# Patient Record
Sex: Female | Born: 1955 | Race: White | Hispanic: No | Marital: Single | Smoking: Never smoker
Health system: Southern US, Community
[De-identification: ages and names within clinical notes are randomized; demographics above are authoritative.]

## PROBLEM LIST (undated history)

## (undated) DIAGNOSIS — M1990 Unspecified osteoarthritis, unspecified site: Secondary | ICD-10-CM

## (undated) DIAGNOSIS — K219 Gastro-esophageal reflux disease without esophagitis: Secondary | ICD-10-CM

## (undated) DIAGNOSIS — G4730 Sleep apnea, unspecified: Secondary | ICD-10-CM

## (undated) DIAGNOSIS — T7840XA Allergy, unspecified, initial encounter: Secondary | ICD-10-CM

## (undated) DIAGNOSIS — A6920 Lyme disease, unspecified: Secondary | ICD-10-CM

## (undated) DIAGNOSIS — I10 Essential (primary) hypertension: Secondary | ICD-10-CM

## (undated) DIAGNOSIS — F32A Depression, unspecified: Secondary | ICD-10-CM

## (undated) DIAGNOSIS — J45909 Unspecified asthma, uncomplicated: Secondary | ICD-10-CM

## (undated) HISTORY — DX: Unspecified osteoarthritis, unspecified site: M19.90

## (undated) HISTORY — DX: Sleep apnea, unspecified: G47.30

## (undated) HISTORY — DX: Gastro-esophageal reflux disease without esophagitis: K21.9

## (undated) HISTORY — DX: Depression, unspecified: F32.A

## (undated) HISTORY — PX: ABDOMINAL HYSTERECTOMY: SHX81

## (undated) HISTORY — DX: Unspecified asthma, uncomplicated: J45.909

## (undated) HISTORY — DX: Allergy, unspecified, initial encounter: T78.40XA

---

## 2018-05-29 DIAGNOSIS — E785 Hyperlipidemia, unspecified: Secondary | ICD-10-CM | POA: Insufficient documentation

## 2019-05-19 DIAGNOSIS — R7303 Prediabetes: Secondary | ICD-10-CM | POA: Insufficient documentation

## 2020-12-21 ENCOUNTER — Other Ambulatory Visit: Payer: Self-pay

## 2020-12-21 ENCOUNTER — Emergency Department: Payer: 59

## 2020-12-21 ENCOUNTER — Encounter: Payer: Self-pay | Admitting: Emergency Medicine

## 2020-12-21 ENCOUNTER — Inpatient Hospital Stay
Admission: EM | Admit: 2020-12-21 | Discharge: 2020-12-24 | DRG: 178 | Disposition: A | Discharge status: Home / Self Care | Payer: 59 | Attending: Internal Medicine | Admitting: Internal Medicine

## 2020-12-21 DIAGNOSIS — R9431 Abnormal electrocardiogram [ECG] [EKG]: Secondary | ICD-10-CM | POA: Diagnosis present

## 2020-12-21 DIAGNOSIS — M25541 Pain in joints of right hand: Secondary | ICD-10-CM | POA: Diagnosis present

## 2020-12-21 DIAGNOSIS — I10 Essential (primary) hypertension: Secondary | ICD-10-CM | POA: Diagnosis present

## 2020-12-21 DIAGNOSIS — Z6832 Body mass index (BMI) 32.0-32.9, adult: Secondary | ICD-10-CM | POA: Diagnosis not present

## 2020-12-21 DIAGNOSIS — Z881 Allergy status to other antibiotic agents status: Secondary | ICD-10-CM

## 2020-12-21 DIAGNOSIS — Z880 Allergy status to penicillin: Secondary | ICD-10-CM | POA: Diagnosis not present

## 2020-12-21 DIAGNOSIS — G8929 Other chronic pain: Secondary | ICD-10-CM | POA: Diagnosis present

## 2020-12-21 DIAGNOSIS — A6920 Lyme disease, unspecified: Secondary | ICD-10-CM | POA: Diagnosis present

## 2020-12-21 DIAGNOSIS — R1013 Epigastric pain: Secondary | ICD-10-CM

## 2020-12-21 DIAGNOSIS — A0839 Other viral enteritis: Secondary | ICD-10-CM | POA: Diagnosis present

## 2020-12-21 DIAGNOSIS — E669 Obesity, unspecified: Secondary | ICD-10-CM | POA: Diagnosis present

## 2020-12-21 DIAGNOSIS — K922 Gastrointestinal hemorrhage, unspecified: Secondary | ICD-10-CM

## 2020-12-21 DIAGNOSIS — U071 COVID-19: Principal | ICD-10-CM | POA: Diagnosis present

## 2020-12-21 DIAGNOSIS — R079 Chest pain, unspecified: Secondary | ICD-10-CM

## 2020-12-21 DIAGNOSIS — K529 Noninfective gastroenteritis and colitis, unspecified: Secondary | ICD-10-CM | POA: Diagnosis present

## 2020-12-21 HISTORY — DX: Essential (primary) hypertension: I10

## 2020-12-21 HISTORY — DX: Lyme disease, unspecified: A69.20

## 2020-12-21 LAB — CBC
HCT: 39.3 % (ref 36.0–46.0)
Hemoglobin: 13.5 g/dL (ref 12.0–15.0)
MCH: 30.5 pg (ref 26.0–34.0)
MCHC: 34.4 g/dL (ref 30.0–36.0)
MCV: 88.9 fL (ref 80.0–100.0)
Platelets: 251 10*3/uL (ref 150–400)
RBC: 4.42 MIL/uL (ref 3.87–5.11)
RDW: 12 % (ref 11.5–15.5)
WBC: 11.7 10*3/uL — ABNORMAL HIGH (ref 4.0–10.5)
nRBC: 0 % (ref 0.0–0.2)

## 2020-12-21 LAB — COMPREHENSIVE METABOLIC PANEL
ALT: 19 U/L (ref 0–44)
AST: 26 U/L (ref 15–41)
Albumin: 4 g/dL (ref 3.5–5.0)
Alkaline Phosphatase: 83 U/L (ref 38–126)
Anion gap: 13 (ref 5–15)
BUN: 17 mg/dL (ref 8–23)
CO2: 20 mmol/L — ABNORMAL LOW (ref 22–32)
Calcium: 8.9 mg/dL (ref 8.9–10.3)
Chloride: 103 mmol/L (ref 98–111)
Creatinine, Ser: 0.76 mg/dL (ref 0.44–1.00)
GFR, Estimated: >60 mL/min (ref >60)
Glucose, Bld: 184 mg/dL — ABNORMAL HIGH (ref 70–99)
Potassium: 3.6 mmol/L (ref 3.5–5.1)
Sodium: 136 mmol/L (ref 135–145)
Total Bilirubin: 0.6 mg/dL (ref 0.3–1.2)
Total Protein: 7.5 g/dL (ref 6.5–8.1)

## 2020-12-21 LAB — TYPE AND SCREEN
ABO/RH(D): A POS
Antibody Screen: NEGATIVE

## 2020-12-21 LAB — TROPONIN I (HIGH SENSITIVITY)
Troponin I (High Sensitivity): 3 ng/L (ref <18)
Troponin I (High Sensitivity): 2 ng/L (ref <18)

## 2020-12-21 LAB — URINALYSIS, COMPLETE (UACMP) WITH MICROSCOPIC
Bacteria, UA: NONE SEEN
Bilirubin Urine: NEGATIVE
Glucose, UA: NEGATIVE mg/dL
Ketones, ur: NEGATIVE mg/dL
Leukocytes,Ua: NEGATIVE
Nitrite: NEGATIVE
Protein, ur: NEGATIVE mg/dL
Specific Gravity, Urine: 1.035 — ABNORMAL HIGH (ref 1.005–1.030)
pH: 5 (ref 5.0–8.0)

## 2020-12-21 LAB — LIPASE, BLOOD: Lipase: 30 U/L (ref 11–51)

## 2020-12-21 LAB — TSH: TSH: 0.528 u[IU]/mL (ref 0.350–4.500)

## 2020-12-21 LAB — PROTIME-INR
INR: 1.1 (ref 0.8–1.2)
Prothrombin Time: 13.3 seconds (ref 11.4–15.2)

## 2020-12-21 LAB — LACTIC ACID, PLASMA: Lactic Acid, Venous: 1.2 mmol/L (ref 0.5–1.9)

## 2020-12-21 LAB — APTT: aPTT: 29 seconds (ref 24–36)

## 2020-12-21 LAB — SARS CORONAVIRUS 2 (TAT 6-24 HRS): SARS Coronavirus 2: POSITIVE — AB

## 2020-12-21 MED ORDER — PROCHLORPERAZINE EDISYLATE 10 MG/2ML IJ SOLN
10.0000 mg | INTRAMUSCULAR | Status: AC | PRN
Start: 2020-12-21 — End: 2020-12-24
  Administered 2020-12-22 – 2020-12-24 (×3): 10 mg via INTRAVENOUS
  Filled 2020-12-21 (×5): qty 2

## 2020-12-21 MED ORDER — MORPHINE SULFATE (PF) 4 MG/ML IV SOLN
4.0000 mg | Freq: Once | INTRAVENOUS | Status: AC
  Administered 2020-12-21: 4 mg via INTRAVENOUS
  Filled 2020-12-21: qty 1

## 2020-12-21 MED ORDER — LACTATED RINGERS IV SOLN: INTRAVENOUS | Status: AC

## 2020-12-21 MED ORDER — ONDANSETRON HCL 4 MG/2ML IJ SOLN: 4.0000 mg | Freq: Four times a day (QID) | INTRAMUSCULAR | Status: DC | PRN

## 2020-12-21 MED ORDER — ACETAMINOPHEN 650 MG RE SUPP: 325.0000 mg | Freq: Four times a day (QID) | RECTAL | Status: AC | PRN

## 2020-12-21 MED ORDER — KETOROLAC TROMETHAMINE 15 MG/ML IJ SOLN
15.0000 mg | Freq: Four times a day (QID) | INTRAMUSCULAR | Status: AC | PRN
Start: 2020-12-21 — End: 2020-12-22
  Administered 2020-12-22: 15 mg via INTRAVENOUS
  Filled 2020-12-21: qty 1

## 2020-12-21 MED ORDER — AMLODIPINE BESYLATE 5 MG PO TABS: 10.0000 mg | ORAL_TABLET | Freq: Every day | ORAL | Status: DC

## 2020-12-21 MED ORDER — ONDANSETRON HCL 4 MG/2ML IJ SOLN
4.0000 mg | Freq: Once | INTRAMUSCULAR | Status: AC
  Administered 2020-12-21: 4 mg via INTRAVENOUS
  Filled 2020-12-21: qty 2

## 2020-12-21 MED ORDER — LORATADINE 10 MG PO TABS: 10.0000 mg | ORAL_TABLET | Freq: Every day | ORAL | Status: DC | PRN

## 2020-12-21 MED ORDER — ONDANSETRON 4 MG PO TBDP
4.0000 mg | ORAL_TABLET | Freq: Once | ORAL | Status: AC | PRN
  Administered 2020-12-21: 4 mg via ORAL
  Filled 2020-12-21: qty 1

## 2020-12-21 MED ORDER — SODIUM CHLORIDE 0.9 % IV SOLN
1000.0000 mL | Freq: Once | INTRAVENOUS | Status: AC
  Administered 2020-12-21: 1000 mL via INTRAVENOUS

## 2020-12-21 MED ORDER — ACETAMINOPHEN 325 MG PO TABS
325.0000 mg | ORAL_TABLET | Freq: Four times a day (QID) | ORAL | Status: AC | PRN
  Administered 2020-12-22 – 2020-12-24 (×3): 325 mg via ORAL
  Filled 2020-12-21 (×3): qty 1

## 2020-12-21 MED ORDER — ONDANSETRON HCL 4 MG PO TABS
4.0000 mg | ORAL_TABLET | Freq: Four times a day (QID) | ORAL | Status: DC | PRN
Start: 2020-12-21 — End: 2020-12-21

## 2020-12-21 MED ORDER — ENOXAPARIN SODIUM 40 MG/0.4ML ~~LOC~~ SOLN
40.0000 mg | SUBCUTANEOUS | Status: DC
  Administered 2020-12-24: 40 mg via SUBCUTANEOUS
  Filled 2020-12-21 (×3): qty 0.4

## 2020-12-21 MED ORDER — PROCHLORPERAZINE MALEATE 5 MG PO TABS
5.0000 mg | ORAL_TABLET | Freq: Four times a day (QID) | ORAL | Status: AC | PRN
  Filled 2020-12-21: qty 1

## 2020-12-21 MED ORDER — IOHEXOL 350 MG/ML SOLN
100.0000 mL | Freq: Once | INTRAVENOUS | Status: AC | PRN
  Administered 2020-12-21: 100 mL via INTRAVENOUS

## 2020-12-21 MED ORDER — AMLODIPINE BESYLATE 10 MG PO TABS
10.0000 mg | ORAL_TABLET | Freq: Every day | ORAL | Status: DC
  Administered 2020-12-21 – 2020-12-23 (×3): 10 mg via ORAL
  Filled 2020-12-21 (×3): qty 1

## 2020-12-21 NOTE — ED Triage Notes (Signed)
Pt in via [facility] with epigastric pain, and sharp central cp. States it all began w/nausea at 8pm last night, and she began to have emesis and diarrhea. States she also had blood in her stool, 2 episodes and bright red blood present - about 2 tablespoons worth. Cp began at 0200, denies any sob

## 2020-12-21 NOTE — H&P (Signed)
History and Physical   Lisa Hurley BJY:[id number] DOB: 1956-06-10 DOA: 12/21/2020  PCP: Patient, [facility]  Patient coming from: Home  I have personally briefly reviewed patient[facility]  Chief Concern: Nausea vomiting, abdominal pain and chest pain  HPI: Lisa OverlandRobin Sanda is a 65 y.o. female with medical history significant for hypertension, positive inflammatory markers for rheumatological diseases will be followed by rheumatologist, truncal obesity, presented to the emergency department for chief concerns of chest pain.  She reports at approximately 7/8 PM on 12/20/2020 patient developed nausea, vomiting, diarrhea.  She also endorsed suprapubic abdominal pain.  She states that she has never had this pain before.  She denies changes to her diet.  On 12/20/2020 she had toes for breakfast, chicken and rice for lunch, broccoli mashed potatoes for dinner.  She states that she did not have a [employer] celebration. She endorses multiple episodes of vomiting and diarrhea too many to count. She reports profound weakness and she was laying on the floor.   The diarrhea was initially brown and then she had bright red blood. She denies melena stool.   She reports the vomit was minimally food, then it was bile and then brown. She reports developing right-sided and then left-sided chest pain on 12/21/2020 which then prompted her to present to the emergency department.  She has a chicken coup. She does not eat eggs as she is allergic to it and the reaction is that she has joint pain.  She has had a chicken coup for two years.   She endorses recent diagnosis of lyme disease and possible rheum disease and has her first rhem appointment 01/19/21.   Vaccinations: has not been vaccinated for covid 19.  She did test positive for COVID-19 late fall 2021 and did not require hospitalization.  Social  history: She denies tobacco, EtOH, and recreational drug use.  ROS: Constitutional: no weight change, no fever ENT/Mouth: no sore throat, no rhinorrhea Eyes: no eye pain, no vision changes Cardiovascular: + chest pain, no dyspnea,  no edema, no palpitations Respiratory: no cough, no sputum, no wheezing Gastrointestinal: + nausea, + vomiting, + diarrhea, no constipation Genitourinary: no urinary incontinence, no dysuria, no hematuria Musculoskeletal: no arthralgias, no myalgias Skin: no skin lesions, no pruritus, Neuro: + weakness, no loss of consciousness, no syncope Psych: no anxiety, no depression, + decrease appetite Heme/Lymph: no bruising, no bleeding  ED Course: Discussed with ED provider, patient requiring hospitalization due to pain control and persisted nausea and vomiting.  Assessment/Plan  Principal Problem:   Gastroenteritis Active Problems:   Prolonged QT interval   Essential hypertension   Gastroenteritis -supportive care Nausea, vomiting, diarrhea, weakness -Status post normal saline 1 L bolus per ED provider -IV fluids with LR 125 cc/h, for 1 day -Compazine p.o. as needed for nausea, Compazine IV as needed for vomiting -BMP in the a.m.  Hypertension-resumed amlodipine 10 mg Nightly Prolonged QT-avoid QT prolonging agents  Healthcare maintenance: Patient reports that she has never had a mammogram or colonoscopy -Extensive discussion with patient recommending that she arrange with her PCP to get mammogram and colonoscopy outpatient upon discharge  Chronic right hand joint pain-patient to show up to her rheumatological initial evaluation appointment on 01/20/2019  Lyme disease-outpatient  Chart reviewed.   DVT prophylaxis: Enoxaparin Code Status: Full code Diet: N.p.o. except for sips with meds, advance as tolerated per nursing and patient Family Communication: No  Disposition Plan: Pending clinical course, likely 12/22/2020 Consults called: No Admission  status: Observation to MedSurg without telemetry  Past Medical History:  Diagnosis Date  . Hypertension   . Lyme disease    History reviewed. No pertinent surgical history.  Social History:  reports that she has never smoked. She has never used smokeless tobacco. She reports previous alcohol use. She reports that she does not use drugs.  Allergies  Allergen Reactions  . Amoxicillin   . Levofloxacin   . Penicillins   . Sulfa Antibiotics    No family history on file. Family history: Family history reviewed and not pertinent  Prior to Admission medications   Not on File   Physical Exam: Vitals:   12/21/20 1200 12/21/20 1215 12/21/20 1230 12/21/20 1300  BP:      Pulse: 98 96 97 97  Resp: Temp:      TempSrc:      SpO2: 94% 91% 93% 93%  Weight:       Constitutional: appears age-appropriate, NAD, calm, comfortable Eyes: PERRL, lids and conjunctivae normal ENMT: Mucous membranes are moist. Posterior pharynx clear of any exudate or lesions. Age-appropriate dentition. Hearing appropriate Neck: normal, supple, no masses, no thyromegaly Respiratory: clear to auscultation bilaterally, no wheezing, no crackles. Normal respiratory effort. No accessory muscle use.  Cardiovascular: Regular rate and rhythm, no murmurs / rubs / gallops. No extremity edema. 2+ pedal pulses. No carotid bruits.  Abdomen: no tenderness, no masses palpated, no hepatosplenomegaly. Bowel sounds positive.  Musculoskeletal: no clubbing / cyanosis. No joint deformity upper and lower extremities. Good ROM, no contractures, no atrophy. Normal muscle tone.  Skin: no rashes, lesions, ulcers. No induration Neurologic: Sensation intact. Strength 5/5 in all 4.  Psychiatric: Normal judgment and insight. Alert and oriented x 3. Normal mood.   EKG: independently reviewed, showing sinus tachycardia with rate of 109, QTc 657  Chest x-ray on Admission: I personally reviewed and I agree with radiologist reading as  below.  DG Chest 2 View  Result Date: 12/21/2020 CLINICAL DATA:  Sharp chest pain EXAM: CHEST - 2 VIEW COMPARISON:  None. FINDINGS: Normal heart size and mediastinal contours. No acute infiltrate or edema. No effusion or pneumothorax. No acute osseous findings. IMPRESSION: No active cardiopulmonary disease. Electronically Signed   By: Marnee Spring M.D.   On: 12/21/2020 05:18   CT ANGIO CHEST AORTA W/CM &/OR WO/CM  Addendum Date: 12/21/2020   ADDENDUM REPORT: 12/21/2020 09:25 ADDENDUM: After acquiring the precontrast and venous phase sequences (not available at time of original dictation), there remains no evidence of acute gastrointestinal hemorrhage. Marliss Coots, MD [facility] Electronically Signed   By: Marliss Coots MD   On: 12/21/2020 09:25   Result Date: 12/21/2020 CLINICAL DATA:  65 year old female with abdominal pain, aortic dissection suspected. EXAM: CT ANGIOGRAPHY CHEST, ABDOMEN AND PELVIS TECHNIQUE: Multidetector CT imaging through the chest, abdomen and pelvis was performed using the standard protocol during bolus administration of intravenous contrast. Multiplanar reconstructed images and MIPs were obtained and reviewed to evaluate the vascular anatomy. CONTRAST:  OMNIPAQUE IOHEXOL 350 MG/ML SOLN COMPARISON:  None. FINDINGS: CTA CHEST FINDINGS VASCULAR Preferential opacification of the thoracic aorta. No evidence of thoracic aortic aneurysm or dissection. Normal heart size. No pericardial effusion. Sinues of Valsalva: 24 mm 29 x 24 mm Sinotubular Junction: 25 mm Ascending Aorta: 34 mm Aortic Arch: 29 mm Descending aorta: 23 mm at the level of the carina [employer]  vessels: Conventional branching pattern. No significant atherosclerotic changes. Coronary arteries: Normal origins and courses. No significant atherosclerotic calcifications. Main pulmonary artery: 26 mm. No evidence of central pulmonary embolism. Pulmonary veins: No  anomalous pulmonary venous return. No evidence of left atrial appendage thrombus. Review of the MIP images confirms the above findings. NON VASCULAR Mediastinum/Nodes: No enlarged mediastinal, hilar, or axillary lymph nodes. Thyroid gland, trachea, and esophagus demonstrate no significant findings. Lungs/Pleura: Lungs are clear. No pleural effusion or pneumothorax. Musculoskeletal: No chest wall abnormality. No acute or significant osseous findings. CTA ABDOMEN AND PELVIS FINDINGS VASCULAR Aorta: Normal caliber aorta without aneurysm, dissection, vasculitis or significant stenosis. Minimal atherosclerotic changes of distal aorta just proximal to the bifurcation. Celiac: Patent without evidence of aneurysm, dissection, vasculitis or significant stenosis. 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 SMA: Patent without evidence of aneurysm, dissection, vasculitis or significant stenosis. Renals: Single bilateral renal arteries are patent without evidence of aneurysm, dissection, vasculitis, fibromuscular dysplasia or significant stenosis. IMA: Patent without evidence of aneurysm, dissection, vasculitis or significant stenosis. Inflow: Patent without evidence of aneurysm, dissection, vasculitis or significant stenosis. Veins: No obvious venous abnormality within the limitations of this arterial phase study. Review of the MIP images confirms the above findings. NON-VASCULAR Hepatobiliary: No focal liver abnormality is seen. No gallstones, gallbladder wall thickening, or biliary dilatation. Pancreas: Unremarkable. No pancreatic ductal dilatation or surrounding inflammatory changes. Spleen: Normal in size without focal abnormality. Adrenals/Urinary Tract: Adrenal glands are unremarkable. Kidneys are normal, without renal calculi, focal lesion, or hydronephrosis. Bladder is unremarkable. Stomach/Bowel: Stomach is within normal limits. Appendix  appears normal. The descending colon is nondistended with minimal surrounding fat stranding. Sigmoid diverticula without evidence of significant cirrhotic inflammatory changes. Lymphatic: No abdominopelvic lymphadenopathy. Reproductive: Status post hysterectomy. No adnexal masses. Other: No abdominal wall hernia or abnormality. No abdominopelvic ascites. Musculoskeletal: No acute or significant osseous findings. IMPRESSION: VASCULAR No evidence of acute aortic syndrome. NON VASCULAR 1. No evidence of acute gastrointestinal hemorrhage, however this study is limited as it is only acquired in arterial phase. Multiphase CTA abdomen pelvis is more sensitive for gastrointestinal hemorrhage detection. 2. Sigmoid diverticulosis without evidence of diverticulitis. 3. Mild pericolonic fat stranding about the descending colon as could be seen with nonspecific colitis, however the colon is completely decompressed which limits evaluation. 4. No acute intrathoracic abnormality. Marliss Coots, MD [facility] Electronically Signed: By: Marliss Coots MD On: 12/21/2020 08:33   CT Angio Abd/Pel w/ and/or w/o  Addendum Date: 12/21/2020   ADDENDUM REPORT: 12/21/2020 09:25 ADDENDUM: After acquiring the precontrast and venous phase sequences (not available at time of original dictation), there remains no evidence of acute gastrointestinal hemorrhage. Marliss Coots, MD [facility] Electronically Signed   By: Marliss Coots MD   On: 12/21/2020 09:25   Result Date: 12/21/2020 CLINICAL DATA:  65 year old female with abdominal pain, aortic dissection suspected. EXAM: CT ANGIOGRAPHY CHEST, ABDOMEN AND PELVIS TECHNIQUE: Multidetector CT imaging through the chest, abdomen and pelvis was performed using the standard protocol during bolus administration of intravenous contrast. Multiplanar reconstructed images and MIPs were  obtained and reviewed to evaluate the vascular anatomy. CONTRAST:  OMNIPAQUE IOHEXOL 350 MG/ML SOLN COMPARISON:  None. FINDINGS: CTA CHEST FINDINGS VASCULAR Preferential opacification of the thoracic aorta. No evidence of thoracic aortic aneurysm or dissection. Normal heart size. No pericardial effusion. Sinues of Valsalva: 24 mm 29 x  24 mm Sinotubular Junction: 25 mm Ascending Aorta: 34 mm Aortic Arch: 29 mm Descending aorta: 23 mm at the level of the carina Branch vessels: Conventional branching pattern. No significant atherosclerotic changes. Coronary arteries: Normal origins and courses. No significant atherosclerotic calcifications. Main pulmonary artery: 26 mm. No evidence of central pulmonary embolism. Pulmonary veins: No anomalous pulmonary venous return. No evidence of left atrial appendage thrombus. Review of the MIP images confirms the above findings. NON VASCULAR Mediastinum/Nodes: No enlarged mediastinal, hilar, or axillary lymph nodes. Thyroid gland, trachea, and esophagus demonstrate no significant findings. Lungs/Pleura: Lungs are clear. No pleural effusion or pneumothorax. Musculoskeletal: No chest wall abnormality. No acute or significant osseous findings. CTA ABDOMEN AND PELVIS FINDINGS VASCULAR Aorta: Normal caliber aorta without aneurysm, dissection, vasculitis or significant stenosis. Minimal atherosclerotic changes of distal aorta just proximal to the bifurcation. Celiac: Patent without evidence of aneurysm, dissection, vasculitis or significant stenosis. 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 SMA: Patent without evidence of aneurysm, dissection, vasculitis or significant stenosis. Renals: Single bilateral renal arteries are patent without evidence of aneurysm, dissection, vasculitis, fibromuscular dysplasia or significant stenosis. IMA: Patent without evidence of aneurysm, dissection, vasculitis or  significant stenosis. Inflow: Patent without evidence of aneurysm, dissection, vasculitis or significant stenosis. Veins: No obvious venous abnormality within the limitations of this arterial phase study. Review of the MIP images confirms the above findings. NON-VASCULAR Hepatobiliary: No focal liver abnormality is seen. No gallstones, gallbladder wall thickening, or biliary dilatation. Pancreas: Unremarkable. No pancreatic ductal dilatation or surrounding inflammatory changes. Spleen: Normal in size without focal abnormality. Adrenals/Urinary Tract: Adrenal glands are unremarkable. Kidneys are normal, without renal calculi, focal lesion, or hydronephrosis. Bladder is unremarkable. Stomach/Bowel: Stomach is within normal limits. Appendix appears normal. The descending colon is nondistended with minimal surrounding fat stranding. Sigmoid diverticula without evidence of significant cirrhotic inflammatory changes. Lymphatic: No abdominopelvic lymphadenopathy. Reproductive: Status post hysterectomy. No adnexal masses. Other: No abdominal wall hernia or abnormality. No abdominopelvic ascites. Musculoskeletal: No acute or significant osseous findings. IMPRESSION: VASCULAR No evidence of acute aortic syndrome. NON VASCULAR 1. No evidence of acute gastrointestinal hemorrhage, however this study is limited as it is only acquired in arterial phase. Multiphase CTA abdomen pelvis is more sensitive for gastrointestinal hemorrhage detection. 2. Sigmoid diverticulosis without evidence of diverticulitis. 3. Mild pericolonic fat stranding about the descending colon as could be seen with nonspecific colitis, however the colon is completely decompressed which limits evaluation. 4. No acute intrathoracic abnormality. Marliss Coots, MD [facility] Electronically Signed: By: Marliss Coots MD On: 12/21/2020 08:33   Labs on Admission: I have personally reviewed following  labs  CBC: Recent Labs  Lab 12/21/20 0449  WBC 11.7*  HGB 13.5  HCT 39.3  MCV 88.9  PLT 251   Basic Metabolic Panel: Recent Labs  Lab 12/21/20 0449  NA 136  K 3.6  CL 103  CO2 20*  GLUCOSE 184*  BUN 17  CREATININE 0.76  CALCIUM 8.9   GFR: CrCl cannot be calculated (Unknown ideal weight.). Liver Function Tests: Recent Labs  Lab 12/21/20 0449  AST 26  ALT 19  ALKPHOS 83  BILITOT 0.6  PROT 7.5  ALBUMIN 4.0   Recent Labs  Lab 12/21/20 0449  LIPASE 30   No results for input(s): AMMONIA in the last 168 hours. Coagulation Profile: Recent Labs  Lab 12/21/20 0731  INR 1.1   Shamieka Gullo N  Yasmin Dibello D.O. [facility]  If 7PM-7AM, please contact overnight-coverage provider If 7AM-7PM, please contact day coverage provider [email or web address]  12/21/2020, 2:11 PM

## 2020-12-21 NOTE — ED Provider Notes (Signed)
[facility] Emergency Department Provider Note   ____________________________________________    I have reviewed the triage vital signs and the nursing notes.   HISTORY  Chief Complaint Abdominal Pain, Chest Pain, and Emesis     HPI Lisa Hurley is a 65 y.o. female who presents with complaints of abdominal pain, chest pain, diarrhea, vomiting.  Patient reports that approximately 8 PM last night she developed nausea and vomiting.  She also reported multiple episodes of diarrhea, at times there was some dark blood in her diarrhea.  She complains of right-sided chest pain which then transition to her left side, now primarily she has pain in her epigastrium.  Denies a history of GERD.  Denies fevers.  Did not take anything for this.  No coffee-ground emesis  Past Medical History:  Diagnosis Date  . Hypertension   . Lyme disease     There are no problems to display for this patient.   History reviewed. No pertinent surgical history.  Prior to Admission medications   Not on File     Allergies Amoxicillin, Levofloxacin, and Penicillins  No family history on file.  Social History Social History   Tobacco Use  . Smoking status: Never Smoker  . Smokeless tobacco: Never Used  Substance Use Topics  . Alcohol use: Not Currently  . Drug use: Never    Review of Systems  Constitutional: No fever/chills Eyes: No visual changes.  ENT: No sore throat. Cardiovascular: As above Respiratory: Denies shortness of breath. Gastrointestinal: As above Genitourinary: Negative for dysuria. Musculoskeletal: Negative for back pain. Skin: Negative for rash. Neurological: Negative for headaches or weakness   ____________________________________________   PHYSICAL EXAM:  VITAL SIGNS: ED Triage Vitals  Enc Vitals Group     BP 12/21/20 0441 113/86     Pulse Rate 12/21/20 0441 (!) 117     Resp 12/21/20 0441 18     Temp 12/21/20 0441 97.6 F (36.4  C)     Temp Source 12/21/20 0441 Oral     SpO2 12/21/20 0441 100 %     Weight 12/21/20 0441 93.9 kg (207 lb)     Height --      Head Circumference --      Peak Flow --      Pain Score 12/21/20 0711 10     Pain Loc --      Pain Edu? --      Excl. in GC? --     Constitutional: Alert and oriented.   Nose: No congestion/rhinnorhea. Mouth/Throat: Mucous membranes are moist.   Neck:  Painless ROM Cardiovascular: Normal rate, regular rhythm. Grossly normal heart sounds.  Good peripheral circulation. Respiratory: Normal respiratory effort.  No retractions. Lungs CTAB. Gastrointestinal: Soft, epigastric and right upper quadrant tenderness but also some lower abdominal tenderness as well no distention.  No CVA tenderness.  Musculoskeletal: No lower extremity tenderness nor edema.  Warm and well perfused Neurologic:  Normal speech and language. No gross focal neurologic deficits are appreciated.  Skin:  Skin is warm, dry and intact. No rash noted. Psychiatric: Mood and affect are normal. Speech and behavior are normal.  ____________________________________________   LABS (all labs ordered are listed, but only abnormal results are displayed)  Labs Reviewed  COMPREHENSIVE METABOLIC PANEL - Abnormal; Notable for the following components:      Result Value   CO2 20 (*)    Glucose, Bld 184 (*)    All other components within normal limits  CBC -  Abnormal; Notable for the following components:   WBC 11.7 (*)    All other components within normal limits  SARS CORONAVIRUS 2 (TAT 6-24 HRS)  LIPASE, BLOOD  APTT  PROTIME-INR  URINALYSIS, COMPLETE (UACMP) WITH MICROSCOPIC  LACTIC ACID, PLASMA  TYPE AND SCREEN  TROPONIN I (HIGH SENSITIVITY)  TROPONIN I (HIGH SENSITIVITY)   ____________________________________________  EKG  ED ECG REPORT I, Jene Every, the attending physician, personally viewed and interpreted this ECG.  Date: 12/21/2020  Rhythm: normal sinus rhythm QRS Axis:  normal Intervals: QT prolonged ST/T Wave abnormalities: normal Narrative Interpretation: no evidence of acute ischemia  ____________________________________________  RADIOLOGY  Chest x-ray reviewed me, no abnormality CT angio chest abdomen pelvis ____________________________________________   PROCEDURES  Procedure(s) performed: No  Procedures   Critical Care performed: No ____________________________________________   INITIAL IMPRESSION / ASSESSMENT AND PLAN / ED COURSE  Pertinent labs & imaging results that were available during my care of the patient were reviewed by me and considered in my medical decision making (see chart for details).  Patient presents with epigastric and chest pain as described above.  Primary complaint of epigastric pain at this time.  Differential includes PUD/GI bleed, less likely cholecystitis given reassuring LFTs.  Normal lipase.  Possibility of dissection given chest pain and epigastric pain.  Not consistent with ACS given normal high sensitive troponin  Patient treated with morphine, received Zofran in triage  Lab work demonstrates mild elevation of white blood cell count which is nonspecific.  Stat CT angiography ordered  CTA scan is overall reassuring, reviewed by me.  On rectal exam significant amount of dark red blood, no stool, she continues to have abdominal pain both in the lower abdomen and in the epigastrium.  Will add on lactic acid  We will discuss with hospitalist    ____________________________________________   FINAL CLINICAL IMPRESSION(S) / ED DIAGNOSES  Final diagnoses:  Epigastric pain  Gastrointestinal hemorrhage, unspecified gastrointestinal hemorrhage type        Note:  This document was prepared using Dragon voice recognition software and may include unintentional dictation errors.   Jene Every, MD 12/21/20 1159

## 2020-12-21 NOTE — ED Notes (Signed)
Pt transported to CT ?

## 2020-12-22 DIAGNOSIS — K529 Noninfective gastroenteritis and colitis, unspecified: Secondary | ICD-10-CM

## 2020-12-22 DIAGNOSIS — A6920 Lyme disease, unspecified: Secondary | ICD-10-CM | POA: Diagnosis present

## 2020-12-22 DIAGNOSIS — G8929 Other chronic pain: Secondary | ICD-10-CM | POA: Diagnosis present

## 2020-12-22 DIAGNOSIS — I10 Essential (primary) hypertension: Secondary | ICD-10-CM | POA: Diagnosis present

## 2020-12-22 DIAGNOSIS — U071 COVID-19: Principal | ICD-10-CM | POA: Diagnosis present

## 2020-12-22 DIAGNOSIS — Z880 Allergy status to penicillin: Secondary | ICD-10-CM | POA: Diagnosis not present

## 2020-12-22 DIAGNOSIS — R9431 Abnormal electrocardiogram [ECG] [EKG]: Secondary | ICD-10-CM | POA: Diagnosis present

## 2020-12-22 DIAGNOSIS — M25541 Pain in joints of right hand: Secondary | ICD-10-CM | POA: Diagnosis present

## 2020-12-22 DIAGNOSIS — Z6832 Body mass index (BMI) 32.0-32.9, adult: Secondary | ICD-10-CM | POA: Diagnosis not present

## 2020-12-22 DIAGNOSIS — K921 Melena: Secondary | ICD-10-CM | POA: Diagnosis not present

## 2020-12-22 DIAGNOSIS — Z881 Allergy status to other antibiotic agents status: Secondary | ICD-10-CM | POA: Diagnosis not present

## 2020-12-22 DIAGNOSIS — A0839 Other viral enteritis: Secondary | ICD-10-CM | POA: Diagnosis present

## 2020-12-22 DIAGNOSIS — E669 Obesity, unspecified: Secondary | ICD-10-CM | POA: Diagnosis present

## 2020-12-22 LAB — BASIC METABOLIC PANEL
Anion gap: 7 (ref 5–15)
BUN: 14 mg/dL (ref 8–23)
CO2: 25 mmol/L (ref 22–32)
Calcium: 8.3 mg/dL — ABNORMAL LOW (ref 8.9–10.3)
Chloride: 106 mmol/L (ref 98–111)
Creatinine, Ser: 0.78 mg/dL (ref 0.44–1.00)
GFR, Estimated: >60 mL/min (ref >60)
Glucose, Bld: 111 mg/dL — ABNORMAL HIGH (ref 70–99)
Potassium: 3.5 mmol/L (ref 3.5–5.1)
Sodium: 138 mmol/L (ref 135–145)

## 2020-12-22 LAB — CBC
HCT: 35 % — ABNORMAL LOW (ref 36.0–46.0)
Hemoglobin: 11.7 g/dL — ABNORMAL LOW (ref 12.0–15.0)
MCH: 30.8 pg (ref 26.0–34.0)
MCHC: 33.4 g/dL (ref 30.0–36.0)
MCV: 92.1 fL (ref 80.0–100.0)
Platelets: 208 10*3/uL (ref 150–400)
RBC: 3.8 MIL/uL — ABNORMAL LOW (ref 3.87–5.11)
RDW: 12.6 % (ref 11.5–15.5)
WBC: 12.9 10*3/uL — ABNORMAL HIGH (ref 4.0–10.5)
nRBC: 0 % (ref 0.0–0.2)

## 2020-12-22 LAB — HIV ANTIBODY (ROUTINE TESTING W REFLEX): HIV Screen 4th Generation wRfx: NONREACTIVE

## 2020-12-22 LAB — OCCULT BLOOD X 1 CARD TO LAB, STOOL: Fecal Occult Bld: POSITIVE — AB

## 2020-12-22 MED ORDER — SODIUM CHLORIDE 0.9 % IV SOLN
200.0000 mg | Freq: Once | INTRAVENOUS | Status: AC
  Administered 2020-12-22: 200 mg via INTRAVENOUS
  Filled 2020-12-22: qty 200

## 2020-12-22 MED ORDER — SODIUM CHLORIDE 0.9 % IV SOLN
100.0000 mg | Freq: Every day | INTRAVENOUS | Status: DC
  Administered 2020-12-23 – 2020-12-24 (×2): 100 mg via INTRAVENOUS
  Filled 2020-12-22: qty 100
  Filled 2020-12-22 (×2): qty 20

## 2020-12-22 MED ORDER — DOXYCYCLINE HYCLATE 100 MG PO TABS
100.0000 mg | ORAL_TABLET | Freq: Two times a day (BID) | ORAL | Status: DC
  Administered 2020-12-22 – 2020-12-24 (×5): 100 mg via ORAL
  Filled 2020-12-22 (×5): qty 1

## 2020-12-22 NOTE — Evaluation (Signed)
Physical Therapy Evaluation Patient Details Name: Lisa Hurley MRN: [id number] DOB: Sep 06, 1956 Today's Date: 12/22/2020   History of Present Illness  Lisa Hurley is a 64yoF who comes to [facility] on 12/21/20 N/V/ABD pain, CP. PMH: HTN, truncal obesity, recent diagnosis of Lyme disease, not seen but rhematology yet. Pt positive for COVID19, reports she also had COVID in Fall 2021  Clinical Impression  Pt admitted with above diagnosis. Pt currently with functional limitations due to the deficits listed below (see "PT Problem List"). Upon entry, pt in bed, awake and agreeable to participate. The pt is alert, pleasant, interactive, and able to provide info regarding prior level of function, both in tolerance and independence. Pt moving near baseline this date, AMB ad lib PRN to BR for ADL. Pt reports only intermittent nausea when up AMB. Pt denies any acute impairment or deficits related to baseline ambulatory mobility. Patient is near baseline, all education completed, and time is given to address all questions/concerns. No additional skilled PT services needed at this time, PT signing off. PT recommends daily ambulation ad lib or with nursing staff as needed to prevent deconditioning.       Follow Up Recommendations No PT follow up    Equipment Recommendations  None recommended by PT    Recommendations for Other Services       Precautions / Restrictions Precautions Precautions: Fall Precaution Comments: walking ad lib with RN, no balance problems at baseline Restrictions Weight Bearing Restrictions: No      Mobility  Bed Mobility Overal bed mobility: Modified Independent                  Transfers Overall transfer level: Modified independent Equipment used: None                Ambulation/Gait Ambulation/Gait assistance: Modified independent (Device/Increase time) Gait Distance (Feet): 30 Feet   Gait Pattern/deviations: WFL(Within Functional Limits)         Stairs            Wheelchair Mobility    Modified Rankin (Stroke Patients Only)       Balance Overall balance assessment: Modified Independent                                           Pertinent Vitals/Pain Pain Assessment: Faces Faces Pain Scale: Hurts even more Pain Location: ABD pain    Home Living Family/patient expects to be discharged to:: Private residence Living Arrangements: Other (Comment) (With grandson (who works)) Available Help at Discharge: Family Type of Home: Mobile home Home Access: Stairs to enter Entrance Stairs-Rails: Lawyer of Steps: 5 Home Layout: One level Home Equipment: None      Prior Function Level of Independence: Independent         Comments: still works Teacher, English as a foreign language; cares forchickens; 2 falls in past 6 months, tripping outside stepping over [employer].     Hand Dominance   Dominant Hand: Right    Extremity/Trunk Assessment   Upper Extremity Assessment Upper Extremity Assessment: Overall WFL for tasks assessed    Lower Extremity Assessment Lower Extremity Assessment: Overall WFL for tasks assessed       Communication      Cognition Arousal/Alertness: Awake/alert Behavior During Therapy: WFL for tasks assessed/performed Overall Cognitive Status: Within Functional Limits for tasks assessed  General Comments      Exercises     Assessment/Plan    PT Assessment Patent does not need any further PT services  PT Problem List Decreased activity tolerance;Decreased mobility       PT Treatment Interventions Patient/family education;Functional mobility training    PT Goals (Current goals can be found in the Care Plan section)  Acute Rehab PT Goals PT Goal Formulation: All assessment and education complete, DC therapy Time For Goal Achievement: 01/05/21    Frequency     Barriers to discharge         Co-evaluation               AM-PAC PT "6 Clicks" Mobility  Outcome Measure Help needed turning from your back to your side while in a flat bed without using bedrails?: None Help needed moving from lying on your back to sitting on the side of a flat bed without using bedrails?: None Help needed moving to and from a bed to a chair (including a wheelchair)?: None Help needed standing up from a chair using your arms (e.g., wheelchair or bedside chair)?: None Help needed to walk in hospital room?: None Help needed climbing 3-5 steps with a railing? : A Little 6 Click Score: 23    End of Session   Activity Tolerance: Patient tolerated treatment well;No increased pain;Patient limited by pain Patient left: in bed;with call bell/phone within reach Nurse Communication: Mobility status PT Visit Diagnosis: Muscle weakness (generalized) (M62.81)    Time: 3903-0092 PT Time Calculation (min) (ACUTE ONLY): 15 min   Charges:   PT Evaluation $PT Eval Moderate Complexity: 1 Mod          4:26 PM, 12/22/20 Lisa Hurley, PT, DPT Physical Therapist - [facility]  [phone] (ASCOM)    Odelle Kosier C 12/22/2020, 4:22 PM

## 2020-12-22 NOTE — Consult Note (Signed)
Remdesivir - Pharmacy Brief Note   O:  ALT: 19 CXR: "No active cardiopulmonary disease" SpO2: On room air   A/P:  12/21/20 SARS-CoV-2 PCR (+)  Remdesivir 200 mg IVPB once followed by 100 mg IVPB daily x 4 days.   Patient is admitted with gastroenteritis. Likely secondary to COVID-19 infection. Given lack of respiratory symptoms and negative CXR, patient may be candidate for short course of remdesivir x 3d. Will defer duration decision to provider.   Tressie Ellis 12/22/2020 7:50 AM

## 2020-12-22 NOTE — Evaluation (Signed)
Occupational Therapy Evaluation Patient Details Name: Lisa Hurley MRN: [id number] DOB: Jan 20, 1956 Today's Date: 12/22/2020    History of Present Illness Lisa Hurley is a 64yoF who comes to [facility] on 12/21/20 N/V/ABD pain, CP. PMH: HTN, truncal obesity, recent diagnosis of Lyme disease, not seen but rhematology yet. Pt positive for COVID19, reports she also had COVID in Fall 2021   Clinical Impression   Lisa Hurley was seen for OT evaluation this date. Prior to hospital admission, pt was Independent in all aspects of ADL/IADL, including working full time. Pt lives with grandson who assists with heavy IADL tasks. Currently pt reporting near baseline independence to perform ADL and mobility tasks - requires increased time only 2/2 abdominal pain. Pt instructed in OT role, abdominal precautions, AE recs, d/c recs, ECS, and home/routines modifications. No skilled OT needs identified. Will sign off. Please re-consult if additional OT needs arise.     Follow Up Recommendations  No OT follow up    Equipment Recommendations  None recommended by OT    Recommendations for Other Services       Precautions / Restrictions Precautions Precautions: Fall Precaution Comments: walking ad lib with RN, no balance problems at baseline Restrictions Weight Bearing Restrictions: No      Mobility Bed Mobility Overal bed mobility: Modified Independent                  Transfers Overall transfer level: Modified independent Equipment used: None                  Balance Overall balance assessment: Modified Independent                                         ADL either performed or assessed with clinical judgement   ADL Overall ADL's : Modified independent                                       General ADL Comments: Increased time 2/2 abdominal pain                  Pertinent Vitals/Pain Pain Assessment: Faces Faces Pain Scale: Hurts even  more Pain Location: ABD pain Pain Descriptors / Indicators: Dull;Discomfort;Grimacing Pain Intervention(s): Limited activity within patient's tolerance;Repositioned     Hand Dominance Right   Extremity/Trunk Assessment Upper Extremity Assessment Upper Extremity Assessment: Overall WFL for tasks assessed (decreased pinch grip)   Lower Extremity Assessment Lower Extremity Assessment: Overall WFL for tasks assessed       Communication Communication Communication: No difficulties   Cognition Arousal/Alertness: Awake/alert Behavior During Therapy: WFL for tasks assessed/performed Overall Cognitive Status: Within Functional Limits for tasks assessed                                     General Comments       Exercises Exercises: Other exercises Other Exercises Other Exercises: Pt educated re: OT role, AE recs, d/c recs, ECS< home/routines modifications   Shoulder Instructions      Home Living Family/patient expects to be discharged to:: Private residence Living Arrangements: Other (Comment) (grandson who works) Available Help at Discharge: Family Type of Home: Mobile home Home Access: Stairs to enter Entrance Stairs-Number of Steps: 5  Entrance Stairs-Rails: Left;Right Home Layout: One level               Home Equipment: None          Prior Functioning/Environment Level of Independence: Independent        Comments: still works Teacher, English as a foreign language; 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 Enjoys gardening and Agricultural consultant Problem List: Decreased strength;Pain         OT Goals(Current goals can be found in the care plan section) Acute Rehab OT Goals Patient Stated Goal: to return home with improved pain OT Goal Formulation: With patient Time For Goal Achievement: 01/05/21 Potential to Achieve Goals: Good   AM-PAC OT "6 Clicks" Daily Activity     Outcome Measure Help from another person eating  meals?: None Help from another person taking care of personal grooming?: None Help from another person toileting, which includes using toliet, bedpan, or urinal?: None Help from another person bathing (including washing, rinsing, drying)?: None Help from another person to put on and taking off regular upper body clothing?: None Help from another person to put on and taking off regular lower body clothing?: None 6 Click Score: 24   End of Session Nurse Communication: Mobility status  Activity Tolerance: Patient tolerated treatment well Patient left: in bed;with call bell/phone within reach  OT Visit Diagnosis: Unsteadiness on feet (R26.81);Other abnormalities of gait and mobility (R26.89)                Time: 6045-4098 OT Time Calculation (min): 20 min Charges:  OT General Charges $OT Visit: 1 Visit OT Evaluation $OT Eval Low Complexity: 1 Low OT Treatments $Self Care/Home Management : 8-22 mins  Kathie Dike, M.S. OTR/L  12/22/20, 4:46 PM  ascom [phone]

## 2020-12-22 NOTE — Plan of Care (Signed)
  Problem: Education: Goal: Knowledge of risk factors and measures for prevention of condition will improve Outcome: Progressing   Problem: Coping: Goal: Psychosocial and spiritual needs will be supported Outcome: Progressing   Problem: Respiratory: Goal: Will maintain a patent airway Outcome: Progressing Goal: Complications related to the disease process, condition or treatment will be avoided or minimized Outcome: Progressing   

## 2020-12-22 NOTE — Progress Notes (Signed)
PROGRESS NOTE    Lisa Hurley  OPF:[id number] DOB: [birth year] DOA: 12/21/2020 PCP: Patient, No Pcp Per    Assessment & Plan:   Principal Problem:   Gastroenteritis Active Problems:   Prolonged QT interval   Essential hypertension   Gastroenteritis: likely secondary to COVID19. Continue on IVFs at 75. Compazine prn for nausea/vomiting  Possible bloody stools: Hb is 11.7. Fecal occult ordered. Will continue to monitor   COVID19 infection: CXR was neg. Will start IV remdesivir. No indications currently for steroids   Leukocytosis: likely secondary to infection   HTN: continue on home dose of amlodipine   Prolonged QT: continue to monitor on tele   Chronic right hand joint pain: has rheumatological initial evaluation appointment on 01/20/2019  Lyme disease: will restart pt's home dose of doxycycline    DVT prophylaxis: lovenox, SCDs Code Status: full  Family Communication:  Disposition Plan: likely d/c back home.  Level of care: Med-Surg   Status is: Inpatient  Remains inpatient appropriate because:IV treatments appropriate due to intensity of illness or inability to take PO and Inpatient level of care appropriate due to severity of illness   Dispo: The patient is from: Home              Anticipated d/c is to: Home              Anticipated d/c date is: 1 day or 2 days               Patient currently is not medically stable to d/c.   Difficult to place patient No          Consultants:      Procedures:    Antimicrobials: will restart home dose of doxycycline    Subjective: Pt c/o nausea   Objective: Vitals:   12/21/20 1552 12/21/20 2046 12/22/20 0134 12/22/20 0401  BP: 124/80 (!) 144/62 138/68 130/71  Pulse: (!) 105 (!) 102 88 (!) 101  Resp: 16 16 16 16   Temp: 98.2 F (36.8 C) 97.7 F (36.5 C) 97.8 F (36.6 C) 98.7 F (37.1 C)  TempSrc:  Oral Oral Oral  SpO2: 98% 96% 98% 94%  Weight:        Intake/Output Summary (Last 24 hours) at  12/22/2020 0743 Last data filed at 12/22/2020 0342 Gross per 24 hour  Intake 906.93 ml  Output --  Net 906.93 ml   Filed Weights   12/21/20 0441  Weight: 93.9 kg    Examination:  General exam: Appears calm and comfortable  Respiratory system: Clear to auscultation. Respiratory effort normal. Cardiovascular system: S1 & S2 +. No rubs, gallops or clicks.  Gastrointestinal system: Abdomen is nondistended, soft and tenderness to palpation. Normal bowel sounds heard. Central nervous system: Alert and oriented. Moves all 4 extremities  Psychiatry: Judgement and insight appear normal. Mood & affect appropriate.     Data Reviewed: I have personally reviewed following labs and imaging studies  CBC: Recent Labs  Lab 12/21/20 0449 12/22/20 0511  WBC 11.7* 12.9*  HGB 13.5 11.7*  HCT 39.3 35.0*  MCV 88.9 92.1  PLT 251 208   Basic Metabolic Panel: Recent Labs  Lab 12/21/20 0449 12/22/20 0511  NA 136 138  K 3.6 3.5  CL 103 106  CO2 20* 25  GLUCOSE 184* 111*  BUN 17 14  CREATININE 0.76 0.78  CALCIUM 8.9 8.3*   GFR: CrCl cannot be calculated (Unknown ideal weight.). Liver Function Tests: Recent Labs  Lab 12/21/20 0449  AST  26  ALT 19  ALKPHOS 83  BILITOT 0.6  PROT 7.5  ALBUMIN 4.0   Recent Labs  Lab 12/21/20 0449  LIPASE 30   No results for input(s): AMMONIA in the last 168 hours. Coagulation Profile: Recent Labs  Lab 12/21/20 0731  INR 1.1   Cardiac Enzymes: No results for input(s): CKTOTAL, CKMB, CKMBINDEX, TROPONINI in the last 168 hours. BNP (last 3 results) No results for input(s): PROBNP in the last 8760 hours. HbA1C: No results for input(s): HGBA1C in the last 72 hours. CBG: No results for input(s): GLUCAP in the last 168 hours. Lipid Profile: No results for input(s): CHOL, HDL, LDLCALC, TRIG, CHOLHDL, LDLDIRECT in the last 72 hours. Thyroid Function Tests: Recent Labs    12/21/20 0449  TSH 0.528   Anemia Panel: No results for input(s):  VITAMINB12, FOLATE, FERRITIN, TIBC, IRON, RETICCTPCT in the last 72 hours. Sepsis Labs: Recent Labs  Lab 12/21/20 1239  LATICACIDVEN 1.2    Recent Results (from the past 240 hour(s))  SARS CORONAVIRUS 2 (TAT 6-24 HRS) Nasopharyngeal Nasopharyngeal Swab     Status: Abnormal   Collection Time: 12/21/20  7:29 AM   Specimen: Nasopharyngeal Swab  Result Value Ref Range Status   SARS Coronavirus 2 POSITIVE (A) NEGATIVE Final    Comment: (NOTE) SARS-CoV-2 target nucleic acids are DETECTED.  The SARS-CoV-2 RNA is generally detectable in upper and lower respiratory specimens during the acute phase of infection. Positive results are indicative of the presence of SARS-CoV-2 RNA. Clinical correlation with patient history and other diagnostic information is  necessary to determine patient infection status. Positive results do not rule out bacterial infection or co-infection with other viruses.  The expected result is Negative.  Fact Sheet for Patients: HairSlick.no  Fact Sheet for Healthcare Providers: quierodirigir.com  This test is not yet approved or cleared by the [place] FDA and  has been authorized for detection and/or diagnosis of SARS-CoV-2 by FDA under an Emergency Use Authorization (EUA). This EUA will remain  in effect (meaning this test can be used) for the duration of the COVID-19 declaration under Section 564(b)(1) of the Act, 21 U. S.C. section 360bbb-3(b)(1), unless the authorization is terminated or revoked sooner.   Performed at [facility], [address], [place], [place] [address]          Radiology Studies: DG Chest 2 View  Result Date: 12/21/2020 CLINICAL DATA:  Sharp chest pain EXAM: CHEST - 2 VIEW COMPARISON:  None. FINDINGS: Normal heart size and mediastinal contours. No acute infiltrate or edema. No effusion or pneumothorax. No acute osseous findings. IMPRESSION: No active  cardiopulmonary disease. Electronically Signed   By: Marnee Spring M.D.   On: 12/21/2020 05:18   CT ANGIO CHEST AORTA W/CM &/OR WO/CM  Addendum Date: 12/21/2020   ADDENDUM REPORT: 12/21/2020 09:25 ADDENDUM: After acquiring the precontrast and venous phase sequences (not available at time of original dictation), there remains no evidence of acute gastrointestinal hemorrhage. Marliss Coots, MD [facility] Radiology Electronically Signed   By: Marliss Coots MD   On: 12/21/2020 09:25   Result Date: 12/21/2020 CLINICAL DATA:  65 year old female with abdominal pain, aortic dissection suspected. EXAM: CT ANGIOGRAPHY CHEST, ABDOMEN AND PELVIS TECHNIQUE: Multidetector CT imaging through the chest, abdomen and pelvis was performed using the standard protocol during bolus administration of intravenous contrast. Multiplanar reconstructed images and MIPs were obtained and reviewed to evaluate the vascular anatomy. CONTRAST:  OMNIPAQUE IOHEXOL 350 MG/ML  SOLN COMPARISON:  None. FINDINGS: CTA CHEST FINDINGS VASCULAR Preferential opacification of the thoracic aorta. No evidence of thoracic aortic aneurysm or dissection. Normal heart size. No pericardial effusion. Sinues of Valsalva: 24 mm 29 x 24 mm Sinotubular Junction: 25 mm Ascending Aorta: 34 mm Aortic Arch: 29 mm Descending aorta: 23 mm at the level of the carina Branch vessels: Conventional branching pattern. No significant atherosclerotic changes. Coronary arteries: Normal origins and courses. No significant atherosclerotic calcifications. Main pulmonary artery: 26 mm. No evidence of central pulmonary embolism. Pulmonary veins: No anomalous pulmonary venous return. No evidence of left atrial appendage thrombus. Review of the MIP images confirms the above findings. NON VASCULAR Mediastinum/Nodes: No enlarged mediastinal, hilar, or axillary lymph nodes. Thyroid gland, trachea, and esophagus demonstrate no significant  findings. Lungs/Pleura: Lungs are clear. No pleural effusion or pneumothorax. Musculoskeletal: No chest wall abnormality. No acute or significant osseous findings. CTA ABDOMEN AND PELVIS FINDINGS VASCULAR Aorta: Normal caliber aorta without aneurysm, dissection, vasculitis or significant stenosis. Minimal atherosclerotic changes of distal aorta just proximal to the bifurcation. Celiac: Patent without evidence of aneurysm, dissection, vasculitis or significant stenosis. 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 SMA: Patent without evidence of aneurysm, dissection, vasculitis or significant stenosis. Renals: Single bilateral renal arteries are patent without evidence of aneurysm, dissection, vasculitis, fibromuscular dysplasia or significant stenosis. IMA: Patent without evidence of aneurysm, dissection, vasculitis or significant stenosis. Inflow: Patent without evidence of aneurysm, dissection, vasculitis or significant stenosis. Veins: No obvious venous abnormality within the limitations of this arterial phase study. Review of the MIP images confirms the above findings. NON-VASCULAR Hepatobiliary: No focal liver abnormality is seen. No gallstones, gallbladder wall thickening, or biliary dilatation. Pancreas: Unremarkable. No pancreatic ductal dilatation or surrounding inflammatory changes. Spleen: Normal in size without focal abnormality. Adrenals/Urinary Tract: Adrenal glands are unremarkable. Kidneys are normal, without renal calculi, focal lesion, or hydronephrosis. Bladder is unremarkable. Stomach/Bowel: Stomach is within normal limits. Appendix appears normal. The descending colon is nondistended with minimal surrounding fat stranding. Sigmoid diverticula without evidence of significant cirrhotic inflammatory changes. Lymphatic: No abdominopelvic lymphadenopathy. Reproductive: Status post hysterectomy. No adnexal masses. Other: No  abdominal wall hernia or abnormality. No abdominopelvic ascites. Musculoskeletal: No acute or significant osseous findings. IMPRESSION: VASCULAR No evidence of acute aortic syndrome. NON VASCULAR 1. No evidence of acute gastrointestinal hemorrhage, however this study is limited as it is only acquired in arterial phase. Multiphase CTA abdomen pelvis is more sensitive for gastrointestinal hemorrhage detection. 2. Sigmoid diverticulosis without evidence of diverticulitis. 3. Mild pericolonic fat stranding about the descending colon as could be seen with nonspecific colitis, however the colon is completely decompressed which limits evaluation. 4. No acute intrathoracic abnormality. Marliss Coots, MD [facility] Radiology Electronically Signed: By: Marliss Coots MD On: 12/21/2020 08:33   CT Angio Abd/Pel w/ and/or w/o  Addendum Date: 12/21/2020   ADDENDUM REPORT: 12/21/2020 09:25 ADDENDUM: After acquiring the precontrast and venous phase sequences (not available at time of original dictation), there remains no evidence of acute gastrointestinal hemorrhage. Marliss Coots, MD [facility] Radiology Electronically Signed   By: Marliss Coots MD   On: 12/21/2020 09:25   Result Date: 12/21/2020 CLINICAL DATA:  65 year old female with abdominal pain, aortic dissection suspected. EXAM: CT ANGIOGRAPHY CHEST, ABDOMEN AND PELVIS TECHNIQUE: Multidetector CT imaging through the chest, abdomen and pelvis was performed using the standard protocol during bolus administration  of intravenous contrast. Multiplanar reconstructed images and MIPs were obtained and reviewed to evaluate the vascular anatomy. CONTRAST:  100mL OMNIPAQUE IOHEXOL 350 MG/ML SOLN COMPARISON:  None. FINDINGS: CTA CHEST FINDINGS VASCULAR Preferential opacification of the thoracic aorta. No evidence of thoracic aortic aneurysm or dissection. Normal heart size. No  pericardial effusion. Sinues of Valsalva: 24 mm 29 x 24 mm Sinotubular Junction: 25 mm Ascending Aorta: 34 mm Aortic Arch: 29 mm Descending aorta: 23 mm at the level of the carina Branch vessels: Conventional branching pattern. No significant atherosclerotic changes. Coronary arteries: Normal origins and courses. No significant atherosclerotic calcifications. Main pulmonary artery: 26 mm. No evidence of central pulmonary embolism. Pulmonary veins: No anomalous pulmonary venous return. No evidence of left atrial appendage thrombus. Review of the MIP images confirms the above findings. NON VASCULAR Mediastinum/Nodes: No enlarged mediastinal, hilar, or axillary lymph nodes. Thyroid gland, trachea, and esophagus demonstrate no significant findings. Lungs/Pleura: Lungs are clear. No pleural effusion or pneumothorax. Musculoskeletal: No chest wall abnormality. No acute or significant osseous findings. CTA ABDOMEN AND PELVIS FINDINGS VASCULAR Aorta: Normal caliber aorta without aneurysm, dissection, vasculitis or significant stenosis. Minimal atherosclerotic changes of distal aorta just proximal to the bifurcation. Celiac: Patent without evidence of aneurysm, dissection, vasculitis or significant stenosis. 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 SMA: Patent without evidence of aneurysm, dissection, vasculitis or significant stenosis. Renals: Single bilateral renal arteries are patent without evidence of aneurysm, dissection, vasculitis, fibromuscular dysplasia or significant stenosis. IMA: Patent without evidence of aneurysm, dissection, vasculitis or significant stenosis. Inflow: Patent without evidence of aneurysm, dissection, vasculitis or significant stenosis. Veins: No obvious venous abnormality within the limitations of this arterial phase study. Review of the MIP images confirms the above findings. NON-VASCULAR Hepatobiliary: No focal  liver abnormality is seen. No gallstones, gallbladder wall thickening, or biliary dilatation. Pancreas: Unremarkable. No pancreatic ductal dilatation or surrounding inflammatory changes. Spleen: Normal in size without focal abnormality. Adrenals/Urinary Tract: Adrenal glands are unremarkable. Kidneys are normal, without renal calculi, focal lesion, or hydronephrosis. Bladder is unremarkable. Stomach/Bowel: Stomach is within normal limits. Appendix appears normal. The descending colon is nondistended with minimal surrounding fat stranding. Sigmoid diverticula without evidence of significant cirrhotic inflammatory changes. Lymphatic: No abdominopelvic lymphadenopathy. Reproductive: Status post hysterectomy. No adnexal masses. Other: No abdominal wall hernia or abnormality. No abdominopelvic ascites. Musculoskeletal: No acute or significant osseous findings. IMPRESSION: VASCULAR No evidence of acute aortic syndrome. NON VASCULAR 1. No evidence of acute gastrointestinal hemorrhage, however this study is limited as it is only acquired in arterial phase. Multiphase CTA abdomen pelvis is more sensitive for gastrointestinal hemorrhage detection. 2. Sigmoid diverticulosis without evidence of diverticulitis. 3. Mild pericolonic fat stranding about the descending colon as could be seen with nonspecific colitis, however the colon is completely decompressed which limits evaluation. 4. No acute intrathoracic abnormality. Marliss Cootsylan Suttle, MD [facility] Electronically Signed: By: Marliss Cootsylan  Suttle MD On: 12/21/2020 08:33        Scheduled Meds: . amLODipine  10 mg Oral QHS  . enoxaparin (LOVENOX) injection  40 mg Subcutaneous Q24H   Continuous Infusions: . lactated ringers 125 mL/hr at 12/22/20 0436     LOS: 0 days    Time spent: 34 mins     Charise KillianJamiese M Bev Drennen, MD [facility] Pager [phone]  If 7PM-7AM, please contact night-coverage 12/22/2020,  7:43 AM

## 2020-12-23 DIAGNOSIS — U071 COVID-19: Secondary | ICD-10-CM | POA: Diagnosis not present

## 2020-12-23 DIAGNOSIS — K921 Melena: Secondary | ICD-10-CM | POA: Diagnosis not present

## 2020-12-23 DIAGNOSIS — K529 Noninfective gastroenteritis and colitis, unspecified: Secondary | ICD-10-CM | POA: Diagnosis not present

## 2020-12-23 LAB — CBC
HCT: 33.7 % — ABNORMAL LOW (ref 36.0–46.0)
Hemoglobin: 11 g/dL — ABNORMAL LOW (ref 12.0–15.0)
MCH: 30.2 pg (ref 26.0–34.0)
MCHC: 32.6 g/dL (ref 30.0–36.0)
MCV: 92.6 fL (ref 80.0–100.0)
Platelets: 192 10*3/uL (ref 150–400)
RBC: 3.64 MIL/uL — ABNORMAL LOW (ref 3.87–5.11)
RDW: 12.2 % (ref 11.5–15.5)
WBC: 10.7 10*3/uL — ABNORMAL HIGH (ref 4.0–10.5)
nRBC: 0 % (ref 0.0–0.2)

## 2020-12-23 LAB — BASIC METABOLIC PANEL
Anion gap: 9 (ref 5–15)
BUN: 14 mg/dL (ref 8–23)
CO2: 26 mmol/L (ref 22–32)
Calcium: 8.2 mg/dL — ABNORMAL LOW (ref 8.9–10.3)
Chloride: 104 mmol/L (ref 98–111)
Creatinine, Ser: 0.69 mg/dL (ref 0.44–1.00)
GFR, Estimated: >60 mL/min (ref >60)
Glucose, Bld: 96 mg/dL (ref 70–99)
Potassium: 3.6 mmol/L (ref 3.5–5.1)
Sodium: 139 mmol/L (ref 135–145)

## 2020-12-23 LAB — D-DIMER, QUANTITATIVE: D-Dimer, Quant: 2.43 ug/mL-FEU — ABNORMAL HIGH (ref 0.00–0.50)

## 2020-12-23 LAB — FERRITIN: Ferritin: 54 ng/mL (ref 11–307)

## 2020-12-23 LAB — C-REACTIVE PROTEIN: CRP: 8.3 mg/dL — ABNORMAL HIGH (ref <1.0)

## 2020-12-23 MED ORDER — PANTOPRAZOLE SODIUM 40 MG PO TBEC
40.0000 mg | DELAYED_RELEASE_TABLET | Freq: Every day | ORAL | Status: DC
  Administered 2020-12-23 – 2020-12-24 (×2): 40 mg via ORAL
  Filled 2020-12-23 (×2): qty 1

## 2020-12-23 NOTE — Progress Notes (Signed)
PROGRESS NOTE    Lisa Hurley  SFK:[id number] DOB: 1956/04/02 DOA: 12/21/2020 PCP: Patient, No Pcp Per    Assessment & Plan:   Principal Problem:   Gastroenteritis Active Problems:   Prolonged QT interval   Essential hypertension   COVID-19   Gastroenteritis: likely secondary to COVID19. Compazine prn for nausea/vomiting  Bloody stools: Hb 11.0, trending down slightly. Fecal occult is positive. No bloody stools today as per pt. Will continue to monitor H&H    COVID19 infection: CXR was neg. Continue on IV remdesivir. No acute indication for steroids currently   Leukocytosis: trending down. Likely secondary to infection   HTN: continue on home dose of amlodipine   Prolonged QT: continue on tele   Chronic right hand joint pain: has rheumatological initial evaluation appointment on 01/20/2019  Lyme disease: will continue on home dose of doxycycline    DVT prophylaxis: lovenox, SCDs Code Status: full  Family Communication:  Disposition Plan: likely d/c back home.  Level of care: Med-Surg   Status is: Inpatient  Remains inpatient appropriate because:IV treatments appropriate due to intensity of illness or inability to take PO and Inpatient level of care appropriate due to severity of illness   Dispo: The patient is from: Home              Anticipated d/c is to: Home              Anticipated d/c date is: 1 day if H&H are stable               Patient currently is not medically stable to d/c.   Difficult to place patient No          Consultants:      Procedures:    Antimicrobials: doxycyline    Subjective: Pt c/o malaise. Pt denied bloody stools this morning    Objective: Vitals:   12/22/20 1200 12/22/20 1608 12/22/20 2259 12/23/20 0626  BP:  137/72 131/69 118/72  Pulse:  96 98 90  Resp:  16 16 16   Temp:  98.1 F (36.7 C) 98.4 F (36.9 C) 98.6 F (37 C)  TempSrc:   Oral Oral  SpO2:  95% 95% 94%  Weight:      Height: 5\' 7"  (1.702 m)       No intake or output data in the 24 hours ending 12/23/20 0735 Filed Weights   12/21/20 0441  Weight: 93.9 kg    Examination:  General exam: Appears comfortable  Respiratory system: clear breath sounds b/l  Cardiovascular system: S1/S2+. No clicks or rubs  Gastrointestinal system:  Abd is soft, obese, mild discomfort to palpation & normal bowel sounds  Central nervous system: alert and oriented. Moves all 4 extremities  Psychiatry: Judgement and insight appear normal. Flat mood and affect      Data Reviewed: I have personally reviewed following labs and imaging studies  CBC: Recent Labs  Lab 12/21/20 0449 12/22/20 0511 12/23/20 0600  WBC 11.7* 12.9* 10.7*  HGB 13.5 11.7* 11.0*  HCT 39.3 35.0* 33.7*  MCV 88.9 92.1 92.6  PLT 251 208 192   Basic Metabolic Panel: Recent Labs  Lab 12/21/20 0449 12/22/20 0511 12/23/20 0600  NA 136 138 139  K 3.6 3.5 3.6  CL 103 106 104  CO2 20* 25 26  GLUCOSE 184* 111* 96  BUN 17 14 14   CREATININE 0.76 0.78 0.69  CALCIUM 8.9 8.3* 8.2*   GFR: Estimated Creatinine Clearance: 81.4 mL/min (by C-G formula based on  SCr of 0.69 mg/dL). Liver Function Tests: Recent Labs  Lab 12/21/20 0449  AST 26  ALT 19  ALKPHOS 83  BILITOT 0.6  PROT 7.5  ALBUMIN 4.0   Recent Labs  Lab 12/21/20 0449  LIPASE 30   No results for input(s): AMMONIA in the last 168 hours. Coagulation Profile: Recent Labs  Lab 12/21/20 0731  INR 1.1   Cardiac Enzymes: No results for input(s): CKTOTAL, CKMB, CKMBINDEX, TROPONINI in the last 168 hours. BNP (last 3 results) No results for input(s): PROBNP in the last 8760 hours. HbA1C: No results for input(s): HGBA1C in the last 72 hours. CBG: No results for input(s): GLUCAP in the last 168 hours. Lipid Profile: No results for input(s): CHOL, HDL, LDLCALC, TRIG, CHOLHDL, LDLDIRECT in the last 72 hours. Thyroid Function Tests: Recent Labs    12/21/20 0449  TSH 0.528   Anemia Panel: Recent Labs     12/23/20 0600  FERRITIN 54   Sepsis Labs: Recent Labs  Lab 12/21/20 1239  LATICACIDVEN 1.2    Recent Results (from the past 240 hour(s))  SARS CORONAVIRUS 2 (TAT 6-24 HRS) Nasopharyngeal Nasopharyngeal Swab     Status: Abnormal   Collection Time: 12/21/20  7:29 AM   Specimen: Nasopharyngeal Swab  Result Value Ref Range Status   SARS Coronavirus 2 POSITIVE (A) NEGATIVE Final    Comment: (NOTE) SARS-CoV-2 target nucleic acids are DETECTED.  The SARS-CoV-2 RNA is generally detectable in upper and lower respiratory specimens during the acute phase of infection. Positive results are indicative of the presence of SARS-CoV-2 RNA. Clinical correlation with patient history and other diagnostic information is  necessary to determine patient infection status. Positive results do not rule out bacterial infection or co-infection with other viruses.  The expected result is Negative.  Fact Sheet for Patients: HairSlick.nohttps://www.fda.gov/media/138098/download  Fact Sheet for Healthcare Providers: quierodirigir.comhttps://www.fda.gov/media/138095/download  This test is not yet approved or cleared by the [place] FDA and  has been authorized for detection and/or diagnosis of SARS-CoV-2 by FDA under an Emergency Use Authorization (EUA). This EUA will remain  in effect (meaning this test can be used) for the duration of the COVID-19 declaration under Section 564(b)(1) of the Act, 21 U. S.C. section 360bbb-3(b)(1), unless the authorization is terminated or revoked sooner.   Performed at [facility], [address] [place], [place] [address]          Radiology Studies: CT ANGIO CHEST AORTA W/CM &/OR WO/CM  Addendum Date: 12/21/2020   ADDENDUM REPORT: 12/21/2020 09:25 ADDENDUM: After acquiring the precontrast and venous phase sequences (not available at time of original dictation), there remains no evidence of acute gastrointestinal hemorrhage. Marliss Cootsylan Suttle, MD [facility] Electronically Signed   By: Marliss Cootsylan  Suttle MD   On: 12/21/2020 09:25   Result Date: 12/21/2020 CLINICAL DATA:  65 year old female with abdominal pain, aortic dissection suspected. EXAM: CT ANGIOGRAPHY CHEST, ABDOMEN AND PELVIS TECHNIQUE: Multidetector CT imaging through the chest, abdomen and pelvis was performed using the standard protocol during bolus administration of intravenous contrast. Multiplanar reconstructed images and MIPs were obtained and reviewed to evaluate the vascular anatomy. CONTRAST:  100mL OMNIPAQUE IOHEXOL 350 MG/ML SOLN COMPARISON:  None. FINDINGS: CTA CHEST FINDINGS VASCULAR Preferential opacification of the thoracic aorta. No evidence of thoracic aortic aneurysm or dissection. Normal heart size. No pericardial effusion. Sinues of Valsalva: 24 mm 29 x 24 mm Sinotubular Junction: 25 mm Ascending Aorta: 34 mm Aortic Arch: 29 mm Descending  aorta: 23 mm at the level of the carina Branch vessels: Conventional branching pattern. No significant atherosclerotic changes. Coronary arteries: Normal origins and courses. No significant atherosclerotic calcifications. Main pulmonary artery: 26 mm. No evidence of central pulmonary embolism. Pulmonary veins: No anomalous pulmonary venous return. No evidence of left atrial appendage thrombus. Review of the MIP images confirms the above findings. NON VASCULAR Mediastinum/Nodes: No enlarged mediastinal, hilar, or axillary lymph nodes. Thyroid gland, trachea, and esophagus demonstrate no significant findings. Lungs/Pleura: Lungs are clear. No pleural effusion or pneumothorax. Musculoskeletal: No chest wall abnormality. No acute or significant osseous findings. CTA ABDOMEN AND PELVIS FINDINGS VASCULAR Aorta: Normal caliber aorta without aneurysm, dissection, vasculitis or significant stenosis. Minimal atherosclerotic changes of distal aorta just proximal to the bifurcation. Celiac: Patent without evidence of aneurysm,  dissection, vasculitis or significant stenosis. 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 SMA: Patent without evidence of aneurysm, dissection, vasculitis or significant stenosis. Renals: Single bilateral renal arteries are patent without evidence of aneurysm, dissection, vasculitis, fibromuscular dysplasia or significant stenosis. IMA: Patent without evidence of aneurysm, dissection, vasculitis or significant stenosis. Inflow: Patent without evidence of aneurysm, dissection, vasculitis or significant stenosis. Veins: No obvious venous abnormality within the limitations of this arterial phase study. Review of the MIP images confirms the above findings. NON-VASCULAR Hepatobiliary: No focal liver abnormality is seen. No gallstones, gallbladder wall thickening, or biliary dilatation. Pancreas: Unremarkable. No pancreatic ductal dilatation or surrounding inflammatory changes. Spleen: Normal in size without focal abnormality. Adrenals/Urinary Tract: Adrenal glands are unremarkable. Kidneys are normal, without renal calculi, focal lesion, or hydronephrosis. Bladder is unremarkable. Stomach/Bowel: Stomach is within normal limits. Appendix appears normal. The descending colon is nondistended with minimal surrounding fat stranding. Sigmoid diverticula without evidence of significant cirrhotic inflammatory changes. Lymphatic: No abdominopelvic lymphadenopathy. Reproductive: Status post hysterectomy. No adnexal masses. Other: No abdominal wall hernia or abnormality. No abdominopelvic ascites. Musculoskeletal: No acute or significant osseous findings. IMPRESSION: VASCULAR No evidence of acute aortic syndrome. NON VASCULAR 1. No evidence of acute gastrointestinal hemorrhage, however this study is limited as it is only acquired in arterial phase. Multiphase CTA abdomen pelvis is more sensitive for gastrointestinal hemorrhage detection. 2. Sigmoid  diverticulosis without evidence of diverticulitis. 3. Mild pericolonic fat stranding about the descending colon as could be seen with nonspecific colitis, however the colon is completely decompressed which limits evaluation. 4. No acute intrathoracic abnormality. Marliss Coots, MD [facility] Electronically Signed: By: Marliss Coots MD On: 12/21/2020 08:33   CT Angio Abd/Pel w/ and/or w/o  Addendum Date: 12/21/2020   ADDENDUM REPORT: 12/21/2020 09:25 ADDENDUM: After acquiring the precontrast and venous phase sequences (not available at time of original dictation), there remains no evidence of acute gastrointestinal hemorrhage. Marliss Coots, MD [facility] Rad[facility] Electronically Signed   By: Marliss Coots MD   On: 12/21/2020 09:25   Result Date: 12/21/2020 CLINICAL DATA:  65 year old female with abdominal pain, aortic dissection suspected. EXAM: CT ANGIOGRAPHY CHEST, ABDOMEN AND PELVIS TECHNIQUE: Multidetector CT imaging through the chest, abdomen and pelvis was performed using the standard protocol during bolus administration of intravenous contrast. Multiplanar reconstructed images and MIPs were obtained and reviewed to evaluate the vascular anatomy. CONTRAST:  OMNIPAQUE IOHEXOL 350 MG/ML SOLN COMPARISON:  None. FINDINGS: CTA CHEST FINDINGS VASCULAR Preferential opacification of the thoracic aorta. No evidence of thoracic aortic aneurysm or dissection. Normal heart size. No  pericardial effusion. Sinues of Valsalva: 24 mm 29 x 24 mm Sinotubular Junction: 25 mm Ascending Aorta: 34 mm Aortic Arch: 29 mm Descending aorta: 23 mm at the level of the carina Branch vessels: Conventional branching pattern. No significant atherosclerotic changes. Coronary arteries: Normal origins and courses. No significant atherosclerotic calcifications. Main pulmonary artery: 26 mm. No evidence of central pulmonary  embolism. Pulmonary veins: No anomalous pulmonary venous return. No evidence of left atrial appendage thrombus. Review of the MIP images confirms the above findings. NON VASCULAR Mediastinum/Nodes: No enlarged mediastinal, hilar, or axillary lymph nodes. Thyroid gland, trachea, and esophagus demonstrate no significant findings. Lungs/Pleura: Lungs are clear. No pleural effusion or pneumothorax. Musculoskeletal: No chest wall abnormality. No acute or significant osseous findings. CTA ABDOMEN AND PELVIS FINDINGS VASCULAR Aorta: Normal caliber aorta without aneurysm, dissection, vasculitis or significant stenosis. Minimal atherosclerotic changes of distal aorta just proximal to the bifurcation. Celiac: Patent without evidence of aneurysm, dissection, vasculitis or significant stenosis. 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 SMA: Patent without evidence of aneurysm, dissection, vasculitis or significant stenosis. Renals: Single bilateral renal arteries are patent without evidence of aneurysm, dissection, vasculitis, fibromuscular dysplasia or significant stenosis. IMA: Patent without evidence of aneurysm, dissection, vasculitis or significant stenosis. Inflow: Patent without evidence of aneurysm, dissection, vasculitis or significant stenosis. Veins: No obvious venous abnormality within the limitations of this arterial phase study. Review of the MIP images confirms the above findings. NON-VASCULAR Hepatobiliary: No focal liver abnormality is seen. No gallstones, gallbladder wall thickening, or biliary dilatation. Pancreas: Unremarkable. No pancreatic ductal dilatation or surrounding inflammatory changes. Spleen: Normal in size without focal abnormality. Adrenals/Urinary Tract: Adrenal glands are unremarkable. Kidneys are normal, without renal calculi, focal lesion, or hydronephrosis. Bladder is unremarkable. Stomach/Bowel: Stomach is within  normal limits. Appendix appears normal. The descending colon is nondistended with minimal surrounding fat stranding. Sigmoid diverticula without evidence of significant cirrhotic inflammatory changes. Lymphatic: No abdominopelvic lymphadenopathy. Reproductive: Status post hysterectomy. No adnexal masses. Other: No abdominal wall hernia or abnormality. No abdominopelvic ascites. Musculoskeletal: No acute or significant osseous findings. IMPRESSION: VASCULAR No evidence of acute aortic syndrome. NON VASCULAR 1. No evidence of acute gastrointestinal hemorrhage, however this study is limited as it is only acquired in arterial phase. Multiphase CTA abdomen pelvis is more sensitive for gastrointestinal hemorrhage detection. 2. Sigmoid diverticulosis without evidence of diverticulitis. 3. Mild pericolonic fat stranding about the descending colon as could be seen with nonspecific colitis, however the colon is completely decompressed which limits evaluation. 4. No acute intrathoracic abnormality. Marliss Coots, MD [facility] Radiology Electronically Signed: By: Marliss Coots MD On: 12/21/2020 08:33        Scheduled Meds: . amLODipine  10 mg Oral QHS  . doxycycline  100 mg Oral Q12H  . enoxaparin (LOVENOX) injection  40 mg Subcutaneous Q24H   Continuous Infusions: . remdesivir 100 mg in NS 100 mL       LOS: 1 day    Time spent: 32 mins     Charise Killian, MD [facility] Pager [phone]  If 7PM-7AM, please contact night-coverage 12/23/2020, 7:35 AM

## 2020-12-24 DIAGNOSIS — I10 Essential (primary) hypertension: Secondary | ICD-10-CM | POA: Diagnosis not present

## 2020-12-24 DIAGNOSIS — K529 Noninfective gastroenteritis and colitis, unspecified: Secondary | ICD-10-CM | POA: Diagnosis not present

## 2020-12-24 DIAGNOSIS — U071 COVID-19: Secondary | ICD-10-CM | POA: Diagnosis not present

## 2020-12-24 LAB — CBC
HCT: 35.2 % — ABNORMAL LOW (ref 36.0–46.0)
Hemoglobin: 11.9 g/dL — ABNORMAL LOW (ref 12.0–15.0)
MCH: 30.9 pg (ref 26.0–34.0)
MCHC: 33.8 g/dL (ref 30.0–36.0)
MCV: 91.4 fL (ref 80.0–100.0)
Platelets: 210 10*3/uL (ref 150–400)
RBC: 3.85 MIL/uL — ABNORMAL LOW (ref 3.87–5.11)
RDW: 12 % (ref 11.5–15.5)
WBC: 7.6 10*3/uL (ref 4.0–10.5)
nRBC: 0 % (ref 0.0–0.2)

## 2020-12-24 LAB — BASIC METABOLIC PANEL
Anion gap: 10 (ref 5–15)
BUN: 12 mg/dL (ref 8–23)
CO2: 25 mmol/L (ref 22–32)
Calcium: 8.4 mg/dL — ABNORMAL LOW (ref 8.9–10.3)
Chloride: 103 mmol/L (ref 98–111)
Creatinine, Ser: 0.59 mg/dL (ref 0.44–1.00)
GFR, Estimated: >60 mL/min (ref >60)
Glucose, Bld: 98 mg/dL (ref 70–99)
Potassium: 3.6 mmol/L (ref 3.5–5.1)
Sodium: 138 mmol/L (ref 135–145)

## 2020-12-24 LAB — FERRITIN: Ferritin: 46 ng/mL (ref 11–307)

## 2020-12-24 LAB — D-DIMER, QUANTITATIVE: D-Dimer, Quant: 2.11 ug/mL-FEU — ABNORMAL HIGH (ref 0.00–0.50)

## 2020-12-24 LAB — C-REACTIVE PROTEIN: CRP: 5.8 mg/dL — ABNORMAL HIGH (ref <1.0)

## 2020-12-24 MED ORDER — PROCHLORPERAZINE MALEATE 5 MG PO TABS
5.0000 mg | ORAL_TABLET | Freq: Three times a day (TID) | ORAL | 0 refills | Status: AC | PRN
Start: 2020-12-24 — End: 2020-12-29

## 2020-12-24 MED ORDER — PANTOPRAZOLE SODIUM 40 MG PO TBEC
40.0000 mg | DELAYED_RELEASE_TABLET | Freq: Every day | ORAL | 0 refills | Status: AC
Start: 2020-12-25 — End: 2021-01-24

## 2020-12-24 NOTE — Plan of Care (Signed)
  Problem: Education: Goal: Knowledge of risk factors and measures for prevention of condition will improve Outcome: Progressing   Problem: Coping: Goal: Psychosocial and spiritual needs will be supported Outcome: Progressing   Problem: Respiratory: Goal: Will maintain a patent airway Outcome: Progressing Goal: Complications related to the disease process, condition or treatment will be avoided or minimized Outcome: Progressing   

## 2020-12-24 NOTE — Progress Notes (Signed)
Patient discharged to home wheeled out of unit by Nurse Tech with all belongings accompanied by her grandson. A+Ox4, VSS, no complaints of pain.  Medications and discharge instructions reviewed.  All questions answered.  PIV remove, tip intact, no bleeding.  Patient verbalized understanding of signs and symptoms of infection.  Patient satisfied with overall care from [facility].

## 2020-12-24 NOTE — Discharge Summary (Signed)
Physician Discharge Summary  Lisa Hurley CXK:[id number] DOB: September 26, 1956 DOA: 12/21/2020  PCP: Patient, No Pcp Per  Admit date: 12/21/2020 Discharge date: 12/24/2020  Admitted From: home  Disposition:  home  Recommendations for Outpatient Follow-up:  1. Follow up with PCP in 1-2 weeks  Home Health: no  Equipment/Devices:   Discharge Condition: stable  CODE STATUS: full  Diet recommendation: Heart Healthy  Brief/Interim Summary: HPI was taken from Dr. Sedalia Hurley: Lisa Hurley is a 65 y.o. female with medical history significant for hypertension, positive inflammatory markers for rheumatological diseases will be followed by rheumatologist, truncal obesity, presented to the emergency department for chief concerns of chest pain.  She reports at approximately 7/8 PM on 12/20/2020 patient developed nausea, vomiting, diarrhea.  She also endorsed suprapubic abdominal pain.  She states that she has never had this pain before.  She denies changes to her diet.  On 12/20/2020 she had toes for breakfast, chicken and rice for lunch, broccoli mashed potatoes for dinner.  She states that she did not have a [employer]. She endorses multiple episodes of vomiting and diarrhea too many to count. She reports profound weakness and she was laying on the floor.   The diarrhea was initially brown and then she had bright red blood. She denies melena stool.   She reports the vomit was minimally food, then it was bile and then brown. She reports developing right-sided and then left-sided chest pain on 12/21/2020 which then prompted her to present to the emergency department.  She has a chicken coup. She does not eat eggs as she is allergic to it and the reaction is that she has joint pain.  She has had a chicken coup for two years.   She endorses recent diagnosis of lyme disease and possible rheum disease and has her first rhem appointment 01/19/21.   Vaccinations: has not been vaccinated for covid 19.   She did test positive for COVID-19 late fall 2021 and did not require hospitalization.   Hospital course from Dr. Mayford Hurley 2/15-2/17/22: Pt presented w/ gastroenteritis likely secondary to COVID19. Pt was treated w/ remdesivir but not steroids as pt was not requiring oxygen and CXR was neg for pneumonia. Of note, pt also c/o intermittently bloody stools of unknown etiology. Fecal occult was positive. Pt Hb was 11.9 on day of d/c and was trending up. Pt will f/u outpatient w/ GI to further assess bloody stools. NSAIDS were noted on pt's home med rec. No NSAIDs. PT/OT evaluated pt but did not feel the pt needed any f/u w/ therapy. For more information, please see the other progress notes.   Discharge Diagnoses:  Principal Problem:   Gastroenteritis Active Problems:   Prolonged QT interval   Essential hypertension   COVID-19  Gastroenteritis: likely secondary to COVID19. Compazine prn for nausea/vomiting  Bloody stools: Hb 11.9, trending up. Fecal occult is positive. No bloody stools today as per pt. Will need f/u outpatient w/ GI. Pt verbalized her understanding. Will continue to monitor H&H    COVID19 infection: CXR was neg. Continue on IV remdesivir. No acute indication for steroids currently   Leukocytosis: resolved   HTN: continue on home dose of amlodipine   Prolonged QT: continue on tele   Chronic right hand joint pain: has rheumatological initial evaluation appointment on 01/20/2019  Lyme disease: will continue on home dose of doxycycline   Discharge Instructions  Discharge Instructions    Diet - low sodium heart healthy   Complete by: As directed  Discharge instructions   Complete by: As directed    F/u w/ PCP in 1-2 weeks. F/u w/ GI in 1 week to follow-up on bloody stools. Will need to get CBC within 1 week to check hemoglobin level. Do not take NSAIDs, medications like ibuprofen, advil, naproxen, naprosyn, aleve.   Increase activity slowly   Complete by: As  directed      Allergies as of 12/24/2020      Reactions   Amoxicillin    Levofloxacin    Penicillins    Sulfa Antibiotics       Medication List    STOP taking these medications   ibuprofen 100 MG tablet Commonly known as: ADVIL     TAKE these medications   amLODipine 10 MG tablet Commonly known as: NORVASC Take 10 mg by mouth daily.   doxycycline 100 MG capsule Commonly known as: VIBRAMYCIN Take 100 mg by mouth 2 (two) times daily. Taking for 2 months and is on week 2.   loratadine 10 MG tablet Commonly known as: CLARITIN Take 10 mg by mouth daily as needed for allergies.   pantoprazole 40 MG tablet Commonly known as: PROTONIX Take 1 tablet (40 mg total) by mouth daily. Start taking on: December 25, 2020   prochlorperazine 5 MG tablet Commonly known as: COMPAZINE Take 1 tablet (5 mg total) by mouth every 8 (eight) hours as needed for up to 5 days for nausea or vomiting.   zinc gluconate 50 MG tablet Take 30 mg by mouth daily.       Allergies  Allergen Reactions  . Amoxicillin   . Levofloxacin   . Penicillins   . Sulfa Antibiotics     Consultations:     Procedures/Studies: DG Chest 2 View  Result Date: 12/21/2020 CLINICAL DATA:  Sharp chest pain EXAM: CHEST - 2 VIEW COMPARISON:  None. FINDINGS: Normal heart size and mediastinal contours. No acute infiltrate or edema. No effusion or pneumothorax. No acute osseous findings. IMPRESSION: No active cardiopulmonary disease. Electronically Signed   By: Marnee Spring M.D.   On: 12/21/2020 05:18   CT ANGIO CHEST AORTA W/CM &/OR WO/CM  Addendum Date: 12/21/2020   ADDENDUM REPORT: 12/21/2020 09:25 ADDENDUM: After acquiring the precontrast and venous phase sequences (not available at time of original dictation), there remains no evidence of acute gastrointestinal hemorrhage. Marliss Coots, MD [facility] Electronically Signed   By: Marliss Coots MD   On:  12/21/2020 09:25   Result Date: 12/21/2020 CLINICAL DATA:  65 year old female with abdominal pain, aortic dissection suspected. EXAM: CT ANGIOGRAPHY CHEST, ABDOMEN AND PELVIS TECHNIQUE: Multidetector CT imaging through the chest, abdomen and pelvis was performed using the standard protocol during bolus administration of intravenous contrast. Multiplanar reconstructed images and MIPs were obtained and reviewed to evaluate the vascular anatomy. CONTRAST:  OMNIPAQUE IOHEXOL 350 MG/ML SOLN COMPARISON:  None. FINDINGS: CTA CHEST FINDINGS VASCULAR Preferential opacification of the thoracic aorta. No evidence of thoracic aortic aneurysm or dissection. Normal heart size. No pericardial effusion. Sinues of Valsalva: 24 mm 29 x 24 mm Sinotubular Junction: 25 mm Ascending Aorta: 34 mm Aortic Arch: 29 mm Descending aorta: 23 mm at the level of the carina Branch vessels: Conventional branching pattern. No significant atherosclerotic changes. Coronary arteries: Normal origins and courses. No significant atherosclerotic calcifications. Main pulmonary artery: 26 mm. No evidence of central pulmonary embolism. Pulmonary veins: No anomalous pulmonary venous return. No evidence of left atrial appendage thrombus. Review of the MIP  images confirms the above findings. NON VASCULAR Mediastinum/Nodes: No enlarged mediastinal, hilar, or axillary lymph nodes. Thyroid gland, trachea, and esophagus demonstrate no significant findings. Lungs/Pleura: Lungs are clear. No pleural effusion or pneumothorax. Musculoskeletal: No chest wall abnormality. No acute or significant osseous findings. CTA ABDOMEN AND PELVIS FINDINGS VASCULAR Aorta: Normal caliber aorta without aneurysm, dissection, vasculitis or significant stenosis. Minimal atherosclerotic changes of distal aorta just proximal to the bifurcation. Celiac: Patent without evidence of aneurysm, dissection, vasculitis or significant stenosis. 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 SMA: Patent without evidence of aneurysm, dissection, vasculitis or significant stenosis. Renals: Single bilateral renal arteries are patent without evidence of aneurysm, dissection, vasculitis, fibromuscular dysplasia or significant stenosis. IMA: Patent without evidence of aneurysm, dissection, vasculitis or significant stenosis. Inflow: Patent without evidence of aneurysm, dissection, vasculitis or significant stenosis. Veins: No obvious venous abnormality within the limitations of this arterial phase study. Review of the MIP images confirms the above findings. NON-VASCULAR Hepatobiliary: No focal liver abnormality is seen. No gallstones, gallbladder wall thickening, or biliary dilatation. Pancreas: Unremarkable. No pancreatic ductal dilatation or surrounding inflammatory changes. Spleen: Normal in size without focal abnormality. Adrenals/Urinary Tract: Adrenal glands are unremarkable. Kidneys are normal, without renal calculi, focal lesion, or hydronephrosis. Bladder is unremarkable. Stomach/Bowel: Stomach is within normal limits. Appendix appears normal. The descending colon is nondistended with minimal surrounding fat stranding. Sigmoid diverticula without evidence of significant cirrhotic inflammatory changes. Lymphatic: No abdominopelvic lymphadenopathy. Reproductive: Status post hysterectomy. No adnexal masses. Other: No abdominal wall hernia or abnormality. No abdominopelvic ascites. Musculoskeletal: No acute or significant osseous findings. IMPRESSION: VASCULAR No evidence of acute aortic syndrome. NON VASCULAR 1. No evidence of acute gastrointestinal hemorrhage, however this study is limited as it is only acquired in arterial phase. Multiphase CTA abdomen pelvis is more sensitive for gastrointestinal hemorrhage detection. 2. Sigmoid diverticulosis without evidence of diverticulitis. 3. Mild pericolonic fat stranding about the  descending colon as could be seen with nonspecific colitis, however the colon is completely decompressed which limits evaluation. 4. No acute intrathoracic abnormality. Marliss Cootsylan Suttle, MD [facility] Electronically Signed: By: Marliss Cootsylan  Suttle MD On: 12/21/2020 08:33   CT Angio Abd/Pel w/ and/or w/o  Addendum Date: 12/21/2020   ADDENDUM REPORT: 12/21/2020 09:25 ADDENDUM: After acquiring the precontrast and venous phase sequences (not available at time of original dictation), there remains no evidence of acute gastrointestinal hemorrhage. Marliss Cootsylan Suttle, MD [facility] Electronically Signed   By: Marliss Cootsylan  Suttle MD   On: 12/21/2020 09:25   Result Date: 12/21/2020 CLINICAL DATA:  65 year old female with abdominal pain, aortic dissection suspected. EXAM: CT ANGIOGRAPHY CHEST, ABDOMEN AND PELVIS TECHNIQUE: Multidetector CT imaging through the chest, abdomen and pelvis was performed using the standard protocol during bolus administration of intravenous contrast. Multiplanar reconstructed images and MIPs were obtained and reviewed to evaluate the vascular anatomy. CONTRAST:  100mL OMNIPAQUE IOHEXOL 350 MG/ML SOLN COMPARISON:  None. FINDINGS: CTA CHEST FINDINGS VASCULAR Preferential opacification of the thoracic aorta. No evidence of thoracic aortic aneurysm or dissection. Normal heart size. No pericardial effusion. Sinues of Valsalva: 24 mm 29 x 24 mm Sinotubular Junction: 25 mm Ascending Aorta: 34 mm Aortic Arch: 29 mm Descending aorta: 23 mm at the level of the carina Branch vessels: Conventional branching pattern. No significant atherosclerotic changes. Coronary arteries: Normal origins and courses. No significant atherosclerotic calcifications. Main pulmonary artery: 26 mm.  No evidence of central pulmonary embolism. Pulmonary veins: No anomalous pulmonary venous return. No evidence of left atrial  appendage thrombus. Review of the MIP images confirms the above findings. NON VASCULAR Mediastinum/Nodes: No enlarged mediastinal, hilar, or axillary lymph nodes. Thyroid gland, trachea, and esophagus demonstrate no significant findings. Lungs/Pleura: Lungs are clear. No pleural effusion or pneumothorax. Musculoskeletal: No chest wall abnormality. No acute or significant osseous findings. CTA ABDOMEN AND PELVIS FINDINGS VASCULAR Aorta: Normal caliber aorta without aneurysm, dissection, vasculitis or significant stenosis. Minimal atherosclerotic changes of distal aorta just proximal to the bifurcation. Celiac: Patent without evidence of aneurysm, dissection, vasculitis or significant stenosis. 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 SMA: Patent without evidence of aneurysm, dissection, vasculitis or significant stenosis. Renals: Single bilateral renal arteries are patent without evidence of aneurysm, dissection, vasculitis, fibromuscular dysplasia or significant stenosis. IMA: Patent without evidence of aneurysm, dissection, vasculitis or significant stenosis. Inflow: Patent without evidence of aneurysm, dissection, vasculitis or significant stenosis. Veins: No obvious venous abnormality within the limitations of this arterial phase study. Review of the MIP images confirms the above findings. NON-VASCULAR Hepatobiliary: No focal liver abnormality is seen. No gallstones, gallbladder wall thickening, or biliary dilatation. Pancreas: Unremarkable. No pancreatic ductal dilatation or surrounding inflammatory changes. Spleen: Normal in size without focal abnormality. Adrenals/Urinary Tract: Adrenal glands are unremarkable. Kidneys are normal, without renal calculi, focal lesion, or hydronephrosis. Bladder is unremarkable. Stomach/Bowel: Stomach is within normal limits. Appendix appears normal. The descending colon is nondistended with minimal  surrounding fat stranding. Sigmoid diverticula without evidence of significant cirrhotic inflammatory changes. Lymphatic: No abdominopelvic lymphadenopathy. Reproductive: Status post hysterectomy. No adnexal masses. Other: No abdominal wall hernia or abnormality. No abdominopelvic ascites. Musculoskeletal: No acute or significant osseous findings. IMPRESSION: VASCULAR No evidence of acute aortic syndrome. NON VASCULAR 1. No evidence of acute gastrointestinal hemorrhage, however this study is limited as it is only acquired in arterial phase. Multiphase CTA abdomen pelvis is more sensitive for gastrointestinal hemorrhage detection. 2. Sigmoid diverticulosis without evidence of diverticulitis. 3. Mild pericolonic fat stranding about the descending colon as could be seen with nonspecific colitis, however the colon is completely decompressed which limits evaluation. 4. No acute intrathoracic abnormality. Marliss Coots, MD [facility] Radiology Electronically Signed: By: Marliss Coots MD On: 12/21/2020 08:33      Subjective: Pt denies any complaints    Discharge Exam: Vitals:   12/24/20 0737 12/24/20 1132  BP: 133/68 (!) 131/58  Pulse: 84 61  Resp: 16 16  Temp: 98.1 F (36.7 C) 98.1 F (36.7 C)  SpO2: 95% 95%   Vitals:   12/24/20 0012 12/24/20 0445 12/24/20 0737 12/24/20 1132  BP: 119/67 121/67 133/68 (!) 131/58  Pulse: 88 91 84 61  Resp: Temp: 97.9 F (36.6 C) 97.8 F (36.6 C) 98.1 F (36.7 C) 98.1 F (36.7 C)  TempSrc: Oral Oral  Oral  SpO2: 95% 97% 95% 95%  Weight:      Height:        General: Pt is alert, awake, not in acute distress Cardiovascular: S1/S2 +, no rubs, no gallops Respiratory: CTA bilaterally, no wheezing, no rhonchi Abdominal: Soft, NT, obese, bowel sounds + Extremities: no edema, no cyanosis    The results of significant diagnostics from this hospitalization (including imaging, microbiology,  ancillary and laboratory) are listed below for reference.     Microbiology: Recent Results (from  the past 240 hour(s))  SARS CORONAVIRUS 2 (TAT 6-24 HRS) Nasopharyngeal Nasopharyngeal Swab     Status: Abnormal   Collection Time: 12/21/20  7:29 AM   Specimen: Nasopharyngeal Swab  Result Value Ref Range Status   SARS Coronavirus 2 POSITIVE (A) NEGATIVE Final    Comment: (NOTE) SARS-CoV-2 target nucleic acids are DETECTED.  The SARS-CoV-2 RNA is generally detectable in upper and lower respiratory specimens during the acute phase of infection. Positive results are indicative of the presence of SARS-CoV-2 RNA. Clinical correlation with patient history and other diagnostic information is  necessary to determine patient infection status. Positive results do not rule out bacterial infection or co-infection with other viruses.  The expected result is Negative.  Fact Sheet for Patients: HairSlick.no  Fact Sheet for Healthcare Providers: quierodirigir.com  This test is not yet approved or cleared by the [place] FDA and  has been authorized for detection and/or diagnosis of SARS-CoV-2 by FDA under an Emergency Use Authorization (EUA). This EUA will remain  in effect (meaning this test can be used) for the duration of the COVID-19 declaration under Section 564(b)(1) of the Act, 21 U. S.C. section 360bbb-3(b)(1), unless the authorization is terminated or revoked sooner.   Performed at [facility], [address] [place], [place] [address]      Labs: BNP (last 3 results) No results for input(s): BNP in the last 8760 hours. Basic Metabolic Panel: Recent Labs  Lab 12/21/20 0449 12/22/20 0511 12/23/20 0600 12/24/20 0705  NA 136 138 139 138  K 3.6 3.5 3.6 3.6  CL 103 106 104 103  CO2 20* 25 26 25   GLUCOSE 184* 111* 96 98  BUN 17 14 14 12   CREATININE 0.76 0.78 0.69 0.59  CALCIUM 8.9 8.3* 8.2* 8.4*   Liver  Function Tests: Recent Labs  Lab 12/21/20 0449  AST 26  ALT 19  ALKPHOS 83  BILITOT 0.6  PROT 7.5  ALBUMIN 4.0   Recent Labs  Lab 12/21/20 0449  LIPASE 30   No results for input(s): AMMONIA in the last 168 hours. CBC: Recent Labs  Lab 12/21/20 0449 12/22/20 0511 12/23/20 0600 12/24/20 0705  WBC 11.7* 12.9* 10.7* 7.6  HGB 13.5 11.7* 11.0* 11.9*  HCT 39.3 35.0* 33.7* 35.2*  MCV 88.9 92.1 92.6 91.4  PLT 251 208 192 210   Cardiac Enzymes: No results for input(s): CKTOTAL, CKMB, CKMBINDEX, TROPONINI in the last 168 hours. BNP: Invalid input(s): POCBNP CBG: No results for input(s): GLUCAP in the last 168 hours. D-Dimer Recent Labs    12/23/20 0600 12/24/20 0705  DDIMER 2.43* 2.11*   Hgb A1c No results for input(s): HGBA1C in the last 72 hours. Lipid Profile No results for input(s): CHOL, HDL, LDLCALC, TRIG, CHOLHDL, LDLDIRECT in the last 72 hours. Thyroid function studies No results for input(s): TSH, T4TOTAL, T3FREE, THYROIDAB in the last 72 hours.  Invalid input(s): FREET3 Anemia work up Recent Labs    12/23/20 0600 12/24/20 0705  FERRITIN 54 46   Urinalysis    Component Value Date/Time   COLORURINE YELLOW (A) 12/21/2020 2010   APPEARANCEUR HAZY (A) 12/21/2020 2010   LABSPEC 1.035 (H) 12/21/2020 2010   PHURINE 5.0 12/21/2020 2010   GLUCOSEU NEGATIVE 12/21/2020 2010   HGBUR SMALL (A) 12/21/2020 2010   BILIRUBINUR NEGATIVE 12/21/2020 2010   KETONESUR NEGATIVE 12/21/2020 2010   PROTEINUR NEGATIVE 12/21/2020 2010   NITRITE NEGATIVE 12/21/2020 2010   LEUKOCYTESUR NEGATIVE 12/21/2020 2010   Sepsis Labs Invalid  input(s): PROCALCITONIN,  WBC,  LACTICIDVEN Microbiology Recent Results (from the past 240 hour(s))  SARS CORONAVIRUS 2 (TAT 6-24 HRS) Nasopharyngeal Nasopharyngeal Swab     Status: Abnormal   Collection Time: 12/21/20  7:29 AM   Specimen: Nasopharyngeal Swab  Result Value Ref Range Status   SARS Coronavirus 2 POSITIVE (A) NEGATIVE Final     Comment: (NOTE) SARS-CoV-2 target nucleic acids are DETECTED.  The SARS-CoV-2 RNA is generally detectable in upper and lower respiratory specimens during the acute phase of infection. Positive results are indicative of the presence of SARS-CoV-2 RNA. Clinical correlation with patient history and other diagnostic information is  necessary to determine patient infection status. Positive results do not rule out bacterial infection or co-infection with other viruses.  The expected result is Negative.  Fact Sheet for Patients: HairSlick.no  Fact Sheet for Healthcare Providers: quierodirigir.com  This test is not yet approved or cleared by the [place] FDA and  has been authorized for detection and/or diagnosis of SARS-CoV-2 by FDA under an Emergency Use Authorization (EUA). This EUA will remain  in effect (meaning this test can be used) for the duration of the COVID-19 declaration under Section 564(b)(1) of the Act, 21 U. S.C. section 360bbb-3(b)(1), unless the authorization is terminated or revoked sooner.   Performed at [facility], [address] [place], [place] [address]      Time coordinating discharge: Over 30 minutes  SIGNED:   Charise Killian, MD  [facility] 12/24/2020, 1:49 PM Pager   If 7PM-7AM, please contact night-coverage

## 2020-12-24 NOTE — Plan of Care (Signed)
  Problem: Education: Goal: Knowledge of risk factors and measures for prevention of condition will improve 12/24/2020 1358 by Orvan Seen, RN Outcome: Completed/Met 12/24/2020 1358 by Orvan Seen, RN Outcome: Progressing   Problem: Coping: Goal: Psychosocial and spiritual needs will be supported 12/24/2020 1358 by Orvan Seen, RN Outcome: Completed/Met 12/24/2020 1358 by Orvan Seen, RN Outcome: Progressing   Problem: Respiratory: Goal: Will maintain a patent airway 12/24/2020 1358 by Orvan Seen, RN Outcome: Completed/Met 12/24/2020 1358 by Orvan Seen, RN Outcome: Progressing Goal: Complications related to the disease process, condition or treatment will be avoided or minimized 12/24/2020 1358 by Orvan Seen, RN Outcome: Completed/Met 12/24/2020 1358 by Orvan Seen, RN Outcome: Progressing

## 2021-10-13 IMAGING — CT CT ANGIO CHEST
2 of 4 series · 13 of 46 positions shown · IV contrast (APPLIED)
Comparison: None.
COMPARISON: None.

Addendum:
CLINICAL DATA: 64-year-old female with abdominal pain, aortic
dissection suspected.

EXAM:
CT ANGIOGRAPHY CHEST, ABDOMEN AND PELVIS
TECHNIQUE: Multidetector CT imaging through the chest, abdomen and pelvis was
performed using the standard protocol during bolus administration of
intravenous contrast. Multiplanar reconstructed images and MIPs were
obtained and reviewed to evaluate the vascular anatomy.
CONTRAST:  100mL OMNIPAQUE IOHEXOL 350 MG/ML SOLN

[Series 6: arterial thins · axial · arterial · 0.95mm/px · z∈[-1111,-574]mm · 10 of 1075 slices shown]
[im 90/1075  lung]
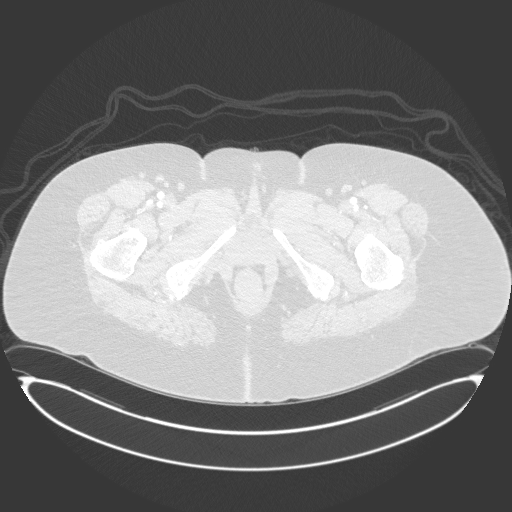
[im 180/1075  soft-tissue]
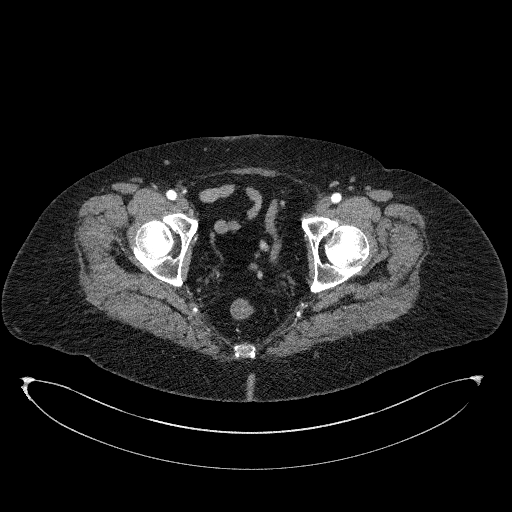
[im 269/1075  lung]
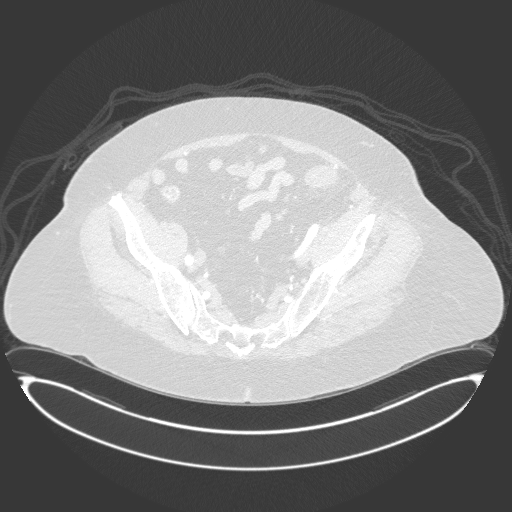
[im 403/1075  soft-tissue]
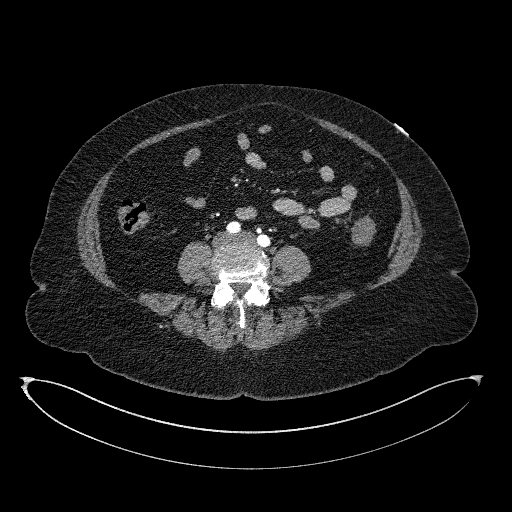
[im 493/1075  lung]
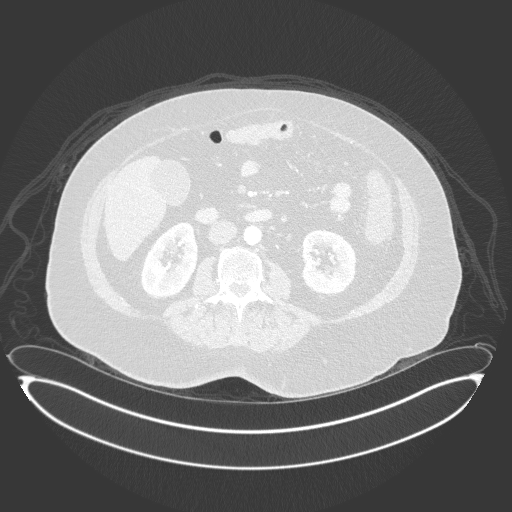
[im 582/1075  soft-tissue]
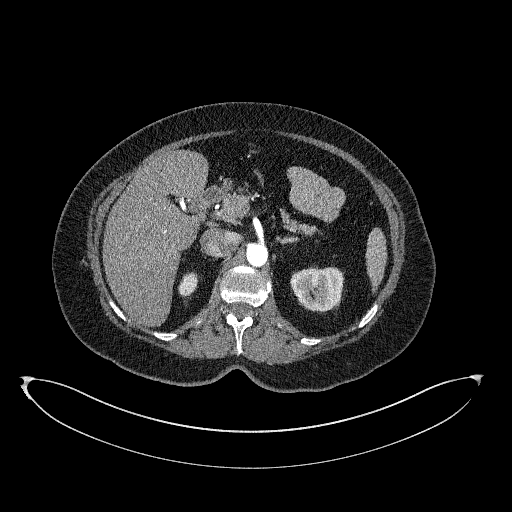
[im 672/1075  lung]
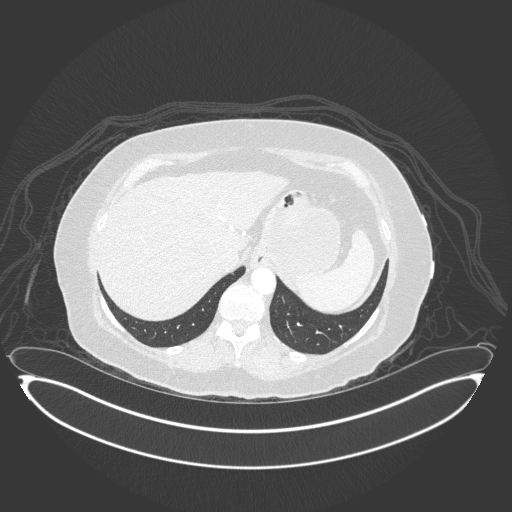
[im 806/1075  soft-tissue]
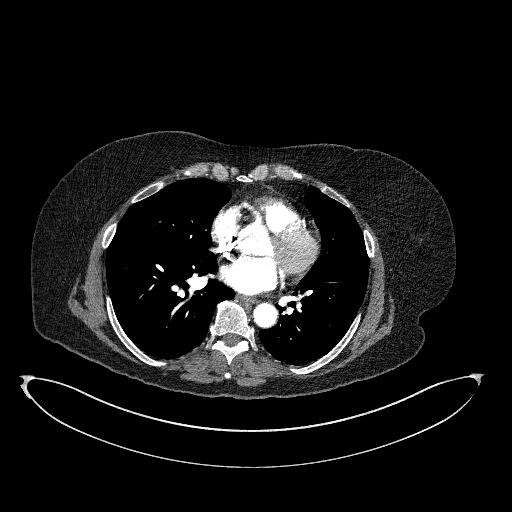
[im 896/1075  lung]
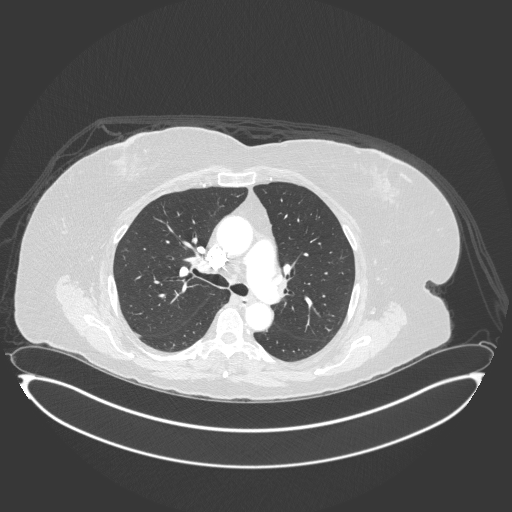
[im 985/1075  soft-tissue]
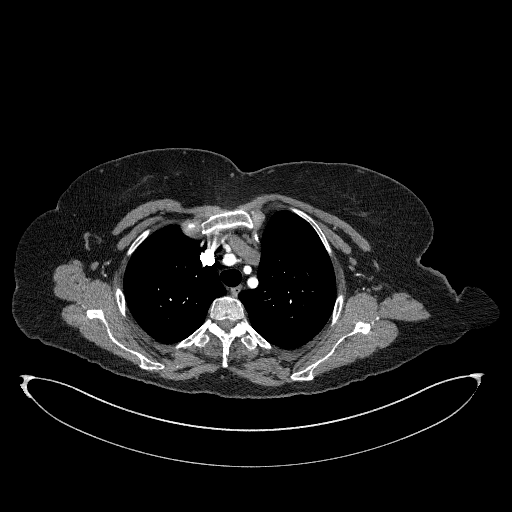

[Series 7: coronal mpr · coronal · 0.98mm/px · 3 of 154 slices shown]
[im 52/154  soft-tissue]
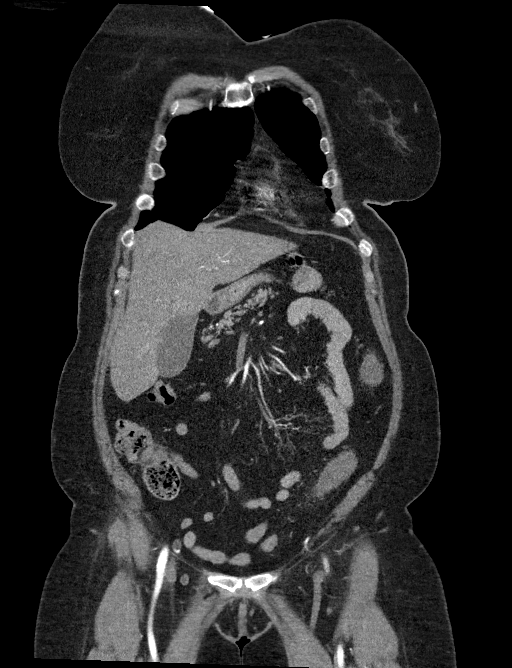
[im 69/154  soft-tissue]
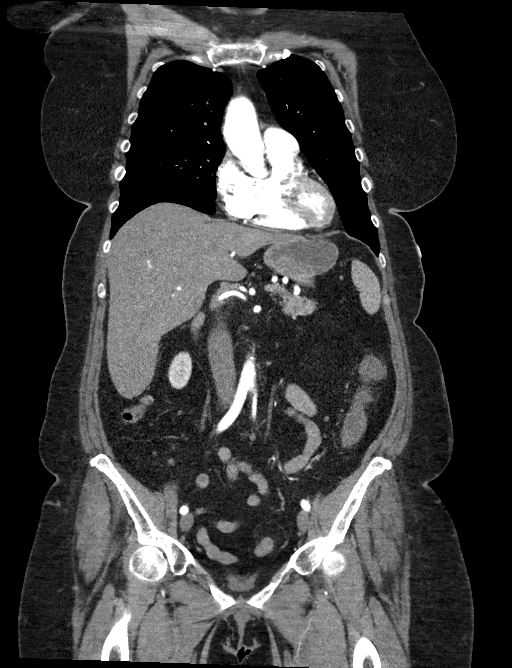
[im 86/154  soft-tissue]
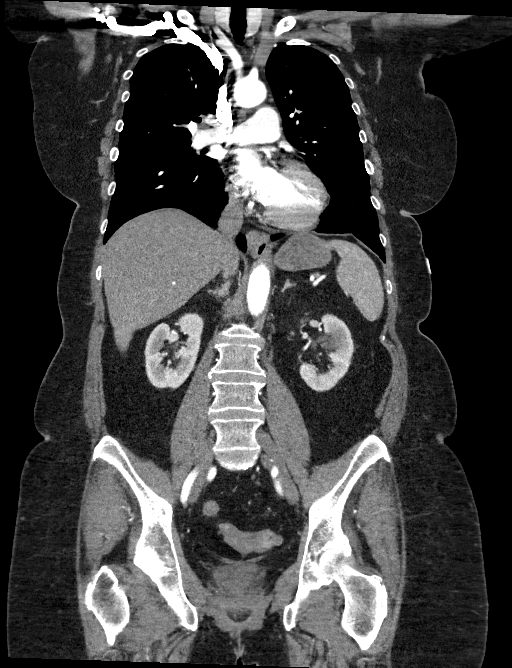

[13 of 46 positions shown; findings below may reference images not displayed]

FINDINGS: CTA CHEST FINDINGS

VASCULAR

Preferential opacification of the thoracic aorta. No evidence of
thoracic aortic aneurysm or dissection. Normal heart size. No
pericardial effusion.

Sinues of Valsalva: 24 mm 29 x 24 mm

Sinotubular Junction: 25 mm

Ascending Aorta: 34 mm

Aortic Arch: 29 mm

Descending aorta: 23 mm at the level of the carina

Branch vessels: Conventional branching pattern. No significant
atherosclerotic changes.

Coronary arteries: Normal origins and courses. No significant
atherosclerotic calcifications.

Main pulmonary artery: 26 mm. No evidence of central pulmonary
embolism.

Pulmonary veins: No anomalous pulmonary venous return. No evidence
of left atrial appendage thrombus.

Review of the MIP images confirms the above findings.

NON VASCULAR

Mediastinum/Nodes: No enlarged mediastinal, hilar, or axillary lymph
nodes. Thyroid gland, trachea, and esophagus demonstrate no
significant findings.

Lungs/Pleura: Lungs are clear. No pleural effusion or pneumothorax.

Musculoskeletal: No chest wall abnormality. No acute or significant
osseous findings.

CTA ABDOMEN AND PELVIS FINDINGS

VASCULAR

Aorta: Normal caliber aorta without aneurysm, dissection, vasculitis
or significant stenosis. Minimal atherosclerotic changes of distal
aorta just proximal to the bifurcation.

Celiac: Patent without evidence of aneurysm, dissection, vasculitis
or significant stenosis. There is a replaced right hepatic artery
which arises directly from the abdominal aorta just inferior to
celiac trunk.

SMA: Patent without evidence of aneurysm, dissection, vasculitis or
significant stenosis.

Renals: Single bilateral renal arteries are patent without evidence
of aneurysm, dissection, vasculitis, fibromuscular dysplasia or
significant stenosis.

IMA: Patent without evidence of aneurysm, dissection, vasculitis or
significant stenosis.

Inflow: Patent without evidence of aneurysm, dissection, vasculitis
or significant stenosis.

Veins: No obvious venous abnormality within the limitations of this
arterial phase study.

Review of the MIP images confirms the above findings.

NON-VASCULAR

Hepatobiliary: No focal liver abnormality is seen. No gallstones,
gallbladder wall thickening, or biliary dilatation.

Pancreas: Unremarkable. No pancreatic ductal dilatation or
surrounding inflammatory changes.

Spleen: Normal in size without focal abnormality.

Adrenals/Urinary Tract: Adrenal glands are unremarkable. Kidneys are
normal, without renal calculi, focal lesion, or hydronephrosis.
Bladder is unremarkable.

Stomach/Bowel: Stomach is within normal limits. Appendix appears
normal. The descending colon is nondistended with minimal
surrounding fat stranding. Sigmoid diverticula without evidence of
significant cirrhotic inflammatory changes.

Lymphatic: No abdominopelvic lymphadenopathy.

Reproductive: Status post hysterectomy. No adnexal masses.

Other: No abdominal wall hernia or abnormality. No abdominopelvic
ascites.

Musculoskeletal: No acute or significant osseous findings.
IMPRESSION: VASCULAR

No evidence of acute aortic syndrome.

NON VASCULAR

1. No evidence of acute gastrointestinal hemorrhage, however this
study is limited as it is only acquired in arterial phase.
Multiphase CTA abdomen pelvis is more sensitive for gastrointestinal
hemorrhage detection.
2. Sigmoid diverticulosis without evidence of diverticulitis.
3. Mild pericolonic fat stranding about the descending colon as
could be seen with nonspecific colitis, however the colon is
completely decompressed which limits evaluation.
4. No acute intrathoracic abnormality.

ADDENDUM:
After acquiring the precontrast and venous phase sequences (not
available at time of original dictation), there remains no evidence
of acute gastrointestinal hemorrhage.

*** End of Addendum ***
FINDINGS: CTA CHEST FINDINGS

VASCULAR

Preferential opacification of the thoracic aorta. No evidence of
thoracic aortic aneurysm or dissection. Normal heart size. No
pericardial effusion.

Sinues of Valsalva: 24 mm 29 x 24 mm

Sinotubular Junction: 25 mm

Ascending Aorta: 34 mm

Aortic Arch: 29 mm

Descending aorta: 23 mm at the level of the carina

Branch vessels: Conventional branching pattern. No significant
atherosclerotic changes.

Coronary arteries: Normal origins and courses. No significant
atherosclerotic calcifications.

Main pulmonary artery: 26 mm. No evidence of central pulmonary
embolism.

Pulmonary veins: No anomalous pulmonary venous return. No evidence
of left atrial appendage thrombus.

Review of the MIP images confirms the above findings.

NON VASCULAR

Mediastinum/Nodes: No enlarged mediastinal, hilar, or axillary lymph
nodes. Thyroid gland, trachea, and esophagus demonstrate no
significant findings.

Lungs/Pleura: Lungs are clear. No pleural effusion or pneumothorax.

Musculoskeletal: No chest wall abnormality. No acute or significant
osseous findings.

CTA ABDOMEN AND PELVIS FINDINGS

VASCULAR

Aorta: Normal caliber aorta without aneurysm, dissection, vasculitis
or significant stenosis. Minimal atherosclerotic changes of distal
aorta just proximal to the bifurcation.

Celiac: Patent without evidence of aneurysm, dissection, vasculitis
or significant stenosis. There is a replaced right hepatic artery
which arises directly from the abdominal aorta just inferior to
celiac trunk.

SMA: Patent without evidence of aneurysm, dissection, vasculitis or
significant stenosis.

Renals: Single bilateral renal arteries are patent without evidence
of aneurysm, dissection, vasculitis, fibromuscular dysplasia or
significant stenosis.

IMA: Patent without evidence of aneurysm, dissection, vasculitis or
significant stenosis.

Inflow: Patent without evidence of aneurysm, dissection, vasculitis
or significant stenosis.

Veins: No obvious venous abnormality within the limitations of this
arterial phase study.

Review of the MIP images confirms the above findings.

NON-VASCULAR

Hepatobiliary: No focal liver abnormality is seen. No gallstones,
gallbladder wall thickening, or biliary dilatation.

Pancreas: Unremarkable. No pancreatic ductal dilatation or
surrounding inflammatory changes.

Spleen: Normal in size without focal abnormality.

Adrenals/Urinary Tract: Adrenal glands are unremarkable. Kidneys are
normal, without renal calculi, focal lesion, or hydronephrosis.
Bladder is unremarkable.

Stomach/Bowel: Stomach is within normal limits. Appendix appears
normal. The descending colon is nondistended with minimal
surrounding fat stranding. Sigmoid diverticula without evidence of
significant cirrhotic inflammatory changes.

Lymphatic: No abdominopelvic lymphadenopathy.

Reproductive: Status post hysterectomy. No adnexal masses.

Other: No abdominal wall hernia or abnormality. No abdominopelvic
ascites.

Musculoskeletal: No acute or significant osseous findings.
IMPRESSION: VASCULAR

No evidence of acute aortic syndrome.

NON VASCULAR

1. No evidence of acute gastrointestinal hemorrhage, however this
study is limited as it is only acquired in arterial phase.
Multiphase CTA abdomen pelvis is more sensitive for gastrointestinal
hemorrhage detection.
2. Sigmoid diverticulosis without evidence of diverticulitis.
3. Mild pericolonic fat stranding about the descending colon as
could be seen with nonspecific colitis, however the colon is
completely decompressed which limits evaluation.
4. No acute intrathoracic abnormality.

## 2022-11-23 ENCOUNTER — Emergency Department
Admission: EM | Admit: 2022-11-23 | Discharge: 2022-11-24 | Disposition: A | Discharge status: Home / Self Care | Payer: 59 | Attending: Emergency Medicine | Admitting: Emergency Medicine

## 2022-11-23 ENCOUNTER — Other Ambulatory Visit: Payer: Self-pay

## 2022-11-23 ENCOUNTER — Ambulatory Visit: Admit: 2022-11-23 | Discharge status: Against Medical Advice | Payer: PRIVATE HEALTH INSURANCE

## 2022-11-23 ENCOUNTER — Emergency Department: Payer: 59

## 2022-11-23 DIAGNOSIS — A0811 Acute gastroenteropathy due to Norwalk agent: Secondary | ICD-10-CM | POA: Insufficient documentation

## 2022-11-23 DIAGNOSIS — Z20822 Contact with and (suspected) exposure to covid-19: Secondary | ICD-10-CM | POA: Diagnosis not present

## 2022-11-23 DIAGNOSIS — R1013 Epigastric pain: Secondary | ICD-10-CM | POA: Diagnosis present

## 2022-11-23 LAB — COMPREHENSIVE METABOLIC PANEL
ALT: 19 U/L (ref 0–44)
AST: 25 U/L (ref 15–41)
Albumin: 4 g/dL (ref 3.5–5.0)
Alkaline Phosphatase: 84 U/L (ref 38–126)
Anion gap: 11 (ref 5–15)
BUN: 15 mg/dL (ref 8–23)
CO2: 23 mmol/L (ref 22–32)
Calcium: 8.9 mg/dL (ref 8.9–10.3)
Chloride: 102 mmol/L (ref 98–111)
Creatinine, Ser: 0.85 mg/dL (ref 0.44–1.00)
GFR, Estimated: >60 mL/min (ref >60)
Glucose, Bld: 129 mg/dL — ABNORMAL HIGH (ref 70–99)
Potassium: 3.4 mmol/L — ABNORMAL LOW (ref 3.5–5.1)
Sodium: 136 mmol/L (ref 135–145)
Total Bilirubin: 0.9 mg/dL (ref 0.3–1.2)
Total Protein: 7.4 g/dL (ref 6.5–8.1)

## 2022-11-23 LAB — CBC
HCT: 42.9 % (ref 36.0–46.0)
Hemoglobin: 14.4 g/dL (ref 12.0–15.0)
MCH: 30.2 pg (ref 26.0–34.0)
MCHC: 33.6 g/dL (ref 30.0–36.0)
MCV: 89.9 fL (ref 80.0–100.0)
Platelets: 298 10*3/uL (ref 150–400)
RBC: 4.77 MIL/uL (ref 3.87–5.11)
RDW: 12.4 % (ref 11.5–15.5)
WBC: 11 10*3/uL — ABNORMAL HIGH (ref 4.0–10.5)
nRBC: 0 % (ref 0.0–0.2)

## 2022-11-23 LAB — URINALYSIS, ROUTINE W REFLEX MICROSCOPIC
Bilirubin Urine: NEGATIVE
Glucose, UA: NEGATIVE mg/dL
Ketones, ur: 5 mg/dL — AB
Nitrite: NEGATIVE
Protein, ur: 100 mg/dL — AB
Specific Gravity, Urine: 1.026 (ref 1.005–1.030)
pH: 5 (ref 5.0–8.0)

## 2022-11-23 LAB — LIPASE, BLOOD: Lipase: 34 U/L (ref 11–51)

## 2022-11-23 LAB — RESP PANEL BY RT-PCR (RSV, FLU A&B, COVID)  RVPGX2
Influenza A by PCR: NEGATIVE
Influenza B by PCR: NEGATIVE
Resp Syncytial Virus by PCR: NEGATIVE
SARS Coronavirus 2 by RT PCR: NEGATIVE

## 2022-11-23 LAB — TROPONIN I (HIGH SENSITIVITY)
Troponin I (High Sensitivity): 9 ng/L (ref <18)
Troponin I (High Sensitivity): 9 ng/L (ref <18)

## 2022-11-23 MED ORDER — PROMETHAZINE HCL 25 MG PO TABS
25.0000 mg | ORAL_TABLET | Freq: Once | ORAL | Status: DC
  Filled 2022-11-23: qty 1

## 2022-11-23 MED ORDER — IOHEXOL 300 MG/ML  SOLN
100.0000 mL | Freq: Once | INTRAMUSCULAR | Status: AC | PRN
  Administered 2022-11-23: 100 mL via INTRAVENOUS

## 2022-11-23 MED ORDER — DICYCLOMINE HCL 10 MG PO CAPS
10.0000 mg | ORAL_CAPSULE | Freq: Once | ORAL | Status: AC
  Administered 2022-11-24: 10 mg via ORAL
  Filled 2022-11-23: qty 1

## 2022-11-23 MED ORDER — SODIUM CHLORIDE 0.9 % IV BOLUS
1000.0000 mL | Freq: Once | INTRAVENOUS | Status: AC
  Administered 2022-11-23: 1000 mL via INTRAVENOUS

## 2022-11-23 MED ORDER — PROMETHAZINE HCL 25 MG/ML IJ SOLN
12.5000 mg | Freq: Once | INTRAMUSCULAR | Status: AC
  Administered 2022-11-24: 12.5 mg via INTRAMUSCULAR
  Filled 2022-11-23: qty 1

## 2022-11-23 MED ORDER — ONDANSETRON 4 MG PO TBDP
4.0000 mg | ORAL_TABLET | Freq: Once | ORAL | Status: AC | PRN
  Administered 2022-11-23: 4 mg via ORAL
  Filled 2022-11-23: qty 1

## 2022-11-23 NOTE — ED Provider Notes (Signed)
[facility] Provider Note  Patient Contact: 10:07 PM (approximate)   History   Abdominal Pain   HPI  Lisa Hurley is a 67 y.o. female who presents emergency department complaining of epigastric abdominal cramping, nausea, vomiting, diarrhea.  Symptoms began rather rapidly for the patient.  Patient states that she was exposed to norovirus over the weekend, started to have explosive vomiting and diarrhea that has been ongoing for 3 days.  Diarrhea has improved slightly but she still feels nausea and has abdominal cramping.  No reported fevers, chills, nasal congestion, sore throat, cough.  Patient states that she has had off-and-on GI issues but has never been diagnosed with IBS, ulcerative colitis or Crohn's.  She is lactose intolerant has not had any dairy.     Physical Exam   Triage Vital Signs: ED Triage Vitals  Enc Vitals Group     BP 11/23/22 1907 (!) 145/91     Pulse Rate 11/23/22 1907 (!) 110     Resp 11/23/22 1907 20     Temp 11/23/22 1907 98.4 F (36.9 C)     Temp Source 11/23/22 1907 Oral     SpO2 11/23/22 1907 99 %     Weight 11/23/22 1910 207 lb 0.2 oz (93.9 kg)     Height 11/23/22 2153 5\' 7"  (1.702 m)     Head Circumference --      Peak Flow --      Pain Score 11/23/22 1910 8     Pain Loc --      Pain Edu? --      Excl. in [place]? --     Most recent vital signs: Vitals:   11/23/22 2259 11/23/22 2259  BP: (!) 130/59   Pulse: 87   Resp: 18 18  Temp:    SpO2: 98%      General: Alert and in no acute distress.   Cardiovascular:  Good peripheral perfusion Respiratory: Normal respiratory effort without tachypnea or retractions. Lungs CTAB. Good air entry to the bases with no decreased or absent breath sounds Gastrointestinal: Bowel sounds 4 quadrants.  Soft to palpation.  Tender in the epigastric region.. No guarding or rigidity. No palpable masses. No distention. No CVA tenderness. Musculoskeletal: Full range of motion to all  extremities.  Neurologic:  No gross focal neurologic deficits are appreciated.  Skin:   No rash noted Other:   ED Results / Procedures / Treatments   Labs (all labs ordered are listed, but only abnormal results are displayed) Labs Reviewed  COMPREHENSIVE METABOLIC PANEL - Abnormal; Notable for the following components:      Result Value   Potassium 3.4 (*)    Glucose, Bld 129 (*)    All other components within normal limits  CBC - Abnormal; Notable for the following components:   WBC 11.0 (*)    All other components within normal limits  URINALYSIS, ROUTINE W REFLEX MICROSCOPIC - Abnormal; Notable for the following components:   Color, Urine AMBER (*)    APPearance HAZY (*)    Hgb urine dipstick SMALL (*)    Ketones, ur 5 (*)    Protein, ur 100 (*)    Leukocytes,Ua TRACE (*)    Bacteria, UA FEW (*)    All other components within normal limits  RESP PANEL BY RT-PCR (RSV, FLU A&B, COVID)  RVPGX2  LIPASE, BLOOD  TROPONIN I (HIGH SENSITIVITY)  TROPONIN I (HIGH SENSITIVITY)     EKG  ED ECG REPORT I, Roderic Palau  D Mischell Branford,  personally viewed and interpreted this ECG.   Date: 11/23/2022  EKG Time: 1914 hrs.  Rate: 111 bpm  Rhythm: nonspecific ST and T waves changes, sinus tachycardia, compared to previous EKG from 12/21/2020, T wave changes now appreciated.  Patient is no longer in junctional rhythm.  Axis: Normal axis  Intervals:none  ST&T Change: No ST elevation or depression noted.  Patient has some inversion of T waves noted in the inferior and anterolateral leads.  No STEMI.  T wave changes in the inferior and anterolateral leads.  Sinus tachycardia.    RADIOLOGY  I personally viewed, evaluated, and interpreted these images as part of my medical decision making, as well as reviewing the written report by the radiologist.  ED Provider Interpretation: Mild distended bowel loops consistent with enteritis.  No acute findings concerning for colitis, cholecystitis,  diverticulitis  CT ABDOMEN PELVIS W CONTRAST  Result Date: 11/23/2022 CLINICAL DATA:  Acute nonlocalized abdominal pain. Vomiting and diarrhea. Low-grade fever. EXAM: CT ABDOMEN AND PELVIS WITH CONTRAST TECHNIQUE: Multidetector CT imaging of the abdomen and pelvis was performed using the standard protocol following bolus administration of intravenous contrast. RADIATION DOSE REDUCTION: This exam was performed according to the departmental dose-optimization program which includes automated exposure control, adjustment of the mA and/or kV according to patient size and/or use of iterative reconstruction technique. CONTRAST:  OMNIPAQUE IOHEXOL 300 MG/ML  SOLN COMPARISON:  12/21/2020 FINDINGS: Lower chest: Lung bases are clear.  Small esophageal hiatal hernia. Hepatobiliary: No focal liver abnormality is seen. No gallstones, gallbladder wall thickening, or biliary dilatation. Pancreas: Unremarkable. No pancreatic ductal dilatation or surrounding inflammatory changes. Spleen: Normal in size without focal abnormality. Adrenals/Urinary Tract: Adrenal glands are unremarkable. Kidneys are normal, without renal calculi, focal lesion, or hydronephrosis. Bladder is unremarkable. Stomach/Bowel: Stomach, small bowel, and colon are not abnormally distended. There is diffuse small bowel wall thickening with fluid-filled small bowel loops and mesenteric infiltration. Small amount of free fluid in the pelvis is likely reactive. Changes likely represent enteritis. Colon is decompressed. No colonic wall thickening. Sigmoid colonic diverticulosis without evidence of acute diverticulitis. Vascular/Lymphatic: Calcification of the aorta. No aneurysm. Prominent lymph nodes in the mesentery and right lower quadrant are likely reactive. No significant retroperitoneal lymphadenopathy. Mesenteric artery and vein appear patent. Reproductive: Status post hysterectomy. No adnexal masses. Other: No free air in the abdomen. Minimal  periumbilical hernia containing fat. Musculoskeletal: No acute or significant osseous findings. IMPRESSION: 1. Fluid-filled nondistended small bowel with wall thickening and mesenteric stranding, likely due to infectious or inflammatory enteritis. 2. Small amount of free fluid in the pelvis and prominent mesenteric lymph nodes are likely reactive. 3. Aortic atherosclerosis. 4. Small esophageal hiatal hernia. Electronically Signed   By: Burman Nieves M.D.   On: 11/23/2022 23:32    PROCEDURES:  Critical Care performed: No  Procedures   MEDICATIONS ORDERED IN ED: Medications  dicyclomine (BENTYL) capsule 10 mg (has no administration in time range)  promethazine (PHENERGAN) injection 12.5 mg (has no administration in time range)  ondansetron (ZOFRAN-ODT) disintegrating tablet 4 mg (4 mg Oral Given 11/23/22 1914)  sodium chloride 0.9 % bolus 1,000 mL (0 mLs Intravenous Stopped 11/24/22 0009)  iohexol (OMNIPAQUE) 300 MG/ML solution 100 mL (100 mLs Intravenous Contrast Given 11/23/22 2313)     IMPRESSION / MDM / ASSESSMENT AND PLAN / ED COURSE  I reviewed the triage vital signs and the nursing notes.  Differential diagnosis includes, but is not limited to, COVID, flu, viral enteritis, norovirus, C. difficile, pancreatitis, cholecystitis, gastritis, colitis, diverticulitis  Patient's presentation is most consistent with acute presentation with potential threat to life or bodily function.   Patient's diagnosis is consistent with norovirus.  Patient presents emergency department after exposure with norovirus.  Patient is having nausea, vomiting, abdominal cramping and diarrhea.  No acute URI symptoms.  Patient with no significant GI history.  Was tender in the epigastric region but based off her recent exposure I suspected norovirus.  No recent antibiotic use or exposure for C. difficile.  Patient had labs, imaging.  Patient has reassuring EKG, labs, CT scan.  CT  does reveal no dilated bowel loops consistent with enteritis which is consistent again with norovirus.  Patient labs under control medications of antiemetics, antispasmodics, antidiarrheal agents.  Drink plenty of fluids.  Follow-up with primary care as needed.  Return precautions discussed with the patient..  Patient is given ED precautions to return to the ED for any worsening or new symptoms.     FINAL CLINICAL IMPRESSION(S) / ED DIAGNOSES   Final diagnoses:  Norovirus     Rx / DC Orders   ED Discharge Orders          Ordered    dicyclomine (BENTYL) 10 MG capsule  3 times daily before meals & bedtime        11/24/22 0009    ondansetron (ZOFRAN-ODT) 4 MG disintegrating tablet  Every 8 hours PRN        11/24/22 0009    loperamide (IMODIUM A-D) 2 MG tablet  4 times daily PRN        11/24/22 0009             Note:  This document was prepared using Dragon voice recognition software and may include unintentional dictation errors.   Darletta Moll, PA-C 11/24/22 0009    Vanessa Montgomery, MD 11/24/22 [phone]

## 2022-11-23 NOTE — ED Triage Notes (Addendum)
Pt in with n/v/d since yesterday, along with "spasming pain to upper abdomen". Endorses at least 10 episodes of emesis or diarrhea. Denies any cp or sob, states low grade fever at home (100F)

## 2022-11-24 MED ORDER — LOPERAMIDE HCL 2 MG PO TABS: 2.0000 mg | ORAL_TABLET | Freq: Four times a day (QID) | ORAL | 1 refills | Status: DC | PRN

## 2022-11-24 MED ORDER — DICYCLOMINE HCL 10 MG PO CAPS: 10.0000 mg | ORAL_CAPSULE | Freq: Three times a day (TID) | ORAL | 1 refills | Status: DC

## 2022-11-24 MED ORDER — ONDANSETRON 4 MG PO TBDP: 4.0000 mg | ORAL_TABLET | Freq: Three times a day (TID) | ORAL | 0 refills | Status: DC | PRN

## 2023-03-08 ENCOUNTER — Telehealth: Payer: Self-pay | Admitting: General Practice

## 2023-03-08 NOTE — Telephone Encounter (Unsigned)
Copied from CRM [id number]. Topic: Appointment Scheduling - Scheduling Inquiry for Clinic >> Mar 08, 2023  1:26 PM Franchot Heidelberg wrote: Reason for CRM: Pt wants to have her welcome to medicare appt during her new patient appt in October, please advise if possible

## 2023-03-08 NOTE — Telephone Encounter (Signed)
Called and explained to patient, verbalized understanding.

## 2023-06-06 ENCOUNTER — Emergency Department
Admission: EM | Admit: 2023-06-06 | Discharge: 2023-06-07 | Disposition: A | Discharge status: Home / Self Care | Payer: Medicare Other | Attending: Student in an Organized Health Care Education/Training Program | Admitting: Student in an Organized Health Care Education/Training Program

## 2023-06-06 ENCOUNTER — Encounter: Payer: Self-pay | Admitting: *Deleted

## 2023-06-06 ENCOUNTER — Emergency Department: Payer: Medicare Other

## 2023-06-06 DIAGNOSIS — K5732 Diverticulitis of large intestine without perforation or abscess without bleeding: Secondary | ICD-10-CM | POA: Diagnosis not present

## 2023-06-06 DIAGNOSIS — Z1152 Encounter for screening for COVID-19: Secondary | ICD-10-CM | POA: Diagnosis not present

## 2023-06-06 DIAGNOSIS — R112 Nausea with vomiting, unspecified: Secondary | ICD-10-CM

## 2023-06-06 DIAGNOSIS — E860 Dehydration: Secondary | ICD-10-CM

## 2023-06-06 DIAGNOSIS — K5792 Diverticulitis of intestine, part unspecified, without perforation or abscess without bleeding: Secondary | ICD-10-CM

## 2023-06-06 LAB — CBC
HCT: 43.8 % (ref 36.0–46.0)
Hemoglobin: 15.2 g/dL — ABNORMAL HIGH (ref 12.0–15.0)
MCH: 30.6 pg (ref 26.0–34.0)
MCHC: 34.7 g/dL (ref 30.0–36.0)
MCV: 88.1 fL (ref 80.0–100.0)
Platelets: 275 10*3/uL (ref 150–400)
RBC: 4.97 MIL/uL (ref 3.87–5.11)
RDW: 12 % (ref 11.5–15.5)
WBC: 9.4 10*3/uL (ref 4.0–10.5)
nRBC: 0 % (ref 0.0–0.2)

## 2023-06-06 LAB — BASIC METABOLIC PANEL
Anion gap: 12 (ref 5–15)
BUN: 11 mg/dL (ref 8–23)
CO2: 19 mmol/L — ABNORMAL LOW (ref 22–32)
Calcium: 9.1 mg/dL (ref 8.9–10.3)
Chloride: 102 mmol/L (ref 98–111)
Creatinine, Ser: 0.73 mg/dL (ref 0.44–1.00)
GFR, Estimated: >60 mL/min (ref >60)
Glucose, Bld: 166 mg/dL — ABNORMAL HIGH (ref 70–99)
Potassium: 3.6 mmol/L (ref 3.5–5.1)
Sodium: 133 mmol/L — ABNORMAL LOW (ref 135–145)

## 2023-06-06 LAB — URINALYSIS, ROUTINE W REFLEX MICROSCOPIC
Bacteria, UA: NONE SEEN
Bilirubin Urine: NEGATIVE
Glucose, UA: NEGATIVE mg/dL
Ketones, ur: 20 mg/dL — AB
Leukocytes,Ua: NEGATIVE
Nitrite: NEGATIVE
Protein, ur: 100 mg/dL — AB
Specific Gravity, Urine: 1.021 (ref 1.005–1.030)
pH: 5 (ref 5.0–8.0)

## 2023-06-06 LAB — TROPONIN I (HIGH SENSITIVITY)
Troponin I (High Sensitivity): 4 ng/L (ref <18)
Troponin I (High Sensitivity): 17 ng/L (ref <18)

## 2023-06-06 LAB — LACTIC ACID, PLASMA: Lactic Acid, Venous: 3 mmol/L (ref 0.5–1.9)

## 2023-06-06 LAB — LIPASE, BLOOD: Lipase: 28 U/L (ref 11–51)

## 2023-06-06 MED ORDER — SODIUM CHLORIDE 0.9 % IV BOLUS
1000.0000 mL | Freq: Once | INTRAVENOUS | Status: AC
  Administered 2023-06-06: 1000 mL via INTRAVENOUS

## 2023-06-06 MED ORDER — MORPHINE SULFATE (PF) 4 MG/ML IV SOLN
4.0000 mg | INTRAVENOUS | Status: DC | PRN
  Administered 2023-06-07: 4 mg via INTRAVENOUS
  Filled 2023-06-06: qty 1

## 2023-06-06 MED ORDER — ONDANSETRON 4 MG PO TBDP
ORAL_TABLET | ORAL | Status: AC
  Filled 2023-06-06: qty 1

## 2023-06-06 MED ORDER — IOHEXOL 350 MG/ML SOLN
100.0000 mL | Freq: Once | INTRAVENOUS | Status: AC | PRN
  Administered 2023-06-06: 100 mL via INTRAVENOUS

## 2023-06-06 MED ORDER — ONDANSETRON 4 MG PO TBDP
4.0000 mg | ORAL_TABLET | Freq: Once | ORAL | Status: AC
  Administered 2023-06-06: 4 mg via ORAL

## 2023-06-06 NOTE — ED Provider Notes (Signed)
[facility] Provider Note    Event Date/Time   First MD Initiated Contact with Patient 06/06/23 2121     (approximate)   History   Emesis and Diarrhea   HPI  Setareh Loiacono is a 67 y.o. female   presents to the ER for evaluation of vomiting diarrhea chest pain abdominal pain since around 7 AM this morning.  Patient feels generalized malaise.  Weak unable to ambulate secondary to malaise.  Denies any lower abdominal pain.  Has had nonbloody watery diarrhea.      Physical Exam   Triage Vital Signs: ED Triage Vitals  Encounter Vitals Group     BP 06/06/23 1924 (!) 157/92     Systolic BP Percentile --      Diastolic BP Percentile --      Pulse Rate 06/06/23 1924 (!) 114     Resp 06/06/23 1925 16     Temp 06/06/23 1924 98.2 F (36.8 C)     Temp Source 06/06/23 1924 Oral     SpO2 06/06/23 1924 100 %     Weight --      Height --      Head Circumference --      Peak Flow --      Pain Score 06/06/23 1923 5     Pain Loc --      Pain Education --      Exclude from Growth Chart --     Most recent vital signs: Vitals:   06/06/23 1925 06/06/23 2308  BP:  (!) 155/88  Pulse:  95  Resp: 16 18  Temp:  98.6 F (37 C)  SpO2:  99%     Constitutional: Alert  Eyes: Conjunctivae are normal.  Head: Atraumatic. Nose: No congestion/rhinnorhea. Mouth/Throat: Mucous membranes are moist.   Neck: Painless ROM.  Cardiovascular:   Good peripheral circulation. Respiratory: Normal respiratory effort.  No retractions.  Gastrointestinal: Soft and nontender.  Musculoskeletal:  no deformity Neurologic:  MAE spontaneously. No gross focal neurologic deficits are appreciated.  Skin:  Skin is warm, dry and intact. No rash noted. Psychiatric: Mood and affect are normal. Speech and behavior are normal.    ED Results / Procedures / Treatments   Labs (all labs ordered are listed, but only abnormal results are displayed) Labs Reviewed  BASIC METABOLIC PANEL -  Abnormal; Notable for the following components:      Result Value   Sodium 133 (*)    CO2 19 (*)    Glucose, Bld 166 (*)    All other components within normal limits  CBC - Abnormal; Notable for the following components:   Hemoglobin 15.2 (*)    All other components within normal limits  URINALYSIS, ROUTINE W REFLEX MICROSCOPIC - Abnormal; Notable for the following components:   Color, Urine YELLOW (*)    APPearance CLEAR (*)    Hgb urine dipstick MODERATE (*)    Ketones, ur 20 (*)    Protein, ur 100 (*)    All other components within normal limits  SARS CORONAVIRUS 2 BY RT PCR  LIPASE, BLOOD  LACTIC ACID, PLASMA  LACTIC ACID, PLASMA  TROPONIN I (HIGH SENSITIVITY)  TROPONIN I (HIGH SENSITIVITY)     EKG  ED ECG REPORT I, Willy Eddy, the attending physician, personally viewed and interpreted this ECG.   Date: 06/06/2023  EKG Time: 19:30  Rate: 120  Rhythm: sinus  Axis: right  Intervals: normal  ST&T Change: non specific st abn  RADIOLOGY Please see ED Course for my review and interpretation.  I personally reviewed all radiographic images ordered to evaluate for the above acute complaints and reviewed radiology reports and findings.  These findings were personally discussed with the patient.  Please see medical record for radiology report.    PROCEDURES:  Critical Care performed: No  Procedures   MEDICATIONS ORDERED IN ED: Medications  morphine (PF) 4 MG/ML injection 4 mg (has no administration in time range)  ondansetron (ZOFRAN-ODT) disintegrating tablet 4 mg (4 mg Oral Given 06/06/23 1934)  sodium chloride 0.9 % bolus 1,000 mL (1,000 mLs Intravenous New Bag/Given 06/06/23 2258)  iohexol (OMNIPAQUE) 350 MG/ML injection 100 mL (100 mLs Intravenous Contrast Given 06/06/23 2311)     IMPRESSION / MDM / ASSESSMENT AND PLAN / ED COURSE  I reviewed the triage vital signs and the nursing notes.                              Differential diagnosis  includes, but is not limited to, ACS, gastritis, enteritis, biliary pathology, pancreatitis, SBO, colitis, diverticulitis, UTI, COVID  Patient presenting to the ER for evaluation of symptoms as described above.  Based on symptoms, risk factors and considered above differential, this presenting complaint could reflect a potentially life-threatening illness therefore the patient will be placed on continuous pulse oximetry and telemetry for monitoring.  Laboratory evaluation will be sent to evaluate for the above complaints.  Give IV fluids, IV pain medication IV antiemetics.  Will order CT imaging.  Chest x-ray on review and interpretation does not show any evidence of consolidation.    Clinical Course as of 06/06/23 2323  Tue Jun 06, 2023  2321 Chest x-ray on my review and interpretation without evidence of infiltrate or consolidation.  Patient be signed out to physician pending follow-up CT imaging and reassessment after symptomatic treatment. [PR]    Clinical Course User Index [PR] Willy Eddy, MD     FINAL CLINICAL IMPRESSION(S) / ED DIAGNOSES   Final diagnoses:  Dehydration  Nausea vomiting and diarrhea     Rx / DC Orders   ED Discharge Orders     None        Note:  This document was prepared using Dragon voice recognition software and may include unintentional dictation errors.    Willy Eddy, MD 06/06/23 [phone]

## 2023-06-06 NOTE — ED Triage Notes (Signed)
Vomiting and diarrhea since around 0700 today. C/o chest and epigastric pain that started around noon

## 2023-06-06 NOTE — ED Notes (Signed)
Patient transported to X-ray 

## 2023-06-06 NOTE — ED Provider Notes (Signed)
-----------------------------------------   11:06 PM on 06/06/2023 -----------------------------------------  Assuming care from Dr. Roxan Hockey.  In short, Lisa Hurley is a 67 y.o. female with a chief complaint of abd pain and N/V.  Refer to the original H&P for additional details.  The current plan of care is to follow up CT scan and reassess.   Clinical Course as of 06/07/23 0329  Tue Jun 06, 2023  2321 Chest x-ray on my review and interpretation without evidence of infiltrate or consolidation.  Patient be signed out to physician pending follow-up CT imaging and reassessment after symptomatic treatment. [PR]  2355 Lactic Acid, Venous(!!): 3.0 Initial lactic acid was 3.0, but this may be a false positive given the otherwise reassuring workup thus far.  Nurse has recollected the lactic acid and is sending a new 1 to see if this is in fact accurate. [CF]  Wed Jun 07, 2023  0006 CT ABDOMEN PELVIS W CONTRAST I viewed and interpreted the patient's CT abdomen/pelvis.  I did not see any gross abnormalities.  The radiologist indicated that there is an area that may represent acute diverticulitis.  After the lactic acid is back, I will discuss it with the patient and see if she wants to try empiric antibiotics. [CF]  0120 Lactic Acid, Venous(!!): 2.5 Repeat lactic lower at 2.5, but still elevated.  Will recheck at 1L LR [CF]  0252 Patient's repeat lactic acid after 1 L of fluids was 1.8.  I suspect that her initial elevation was volume related and there is no evidence that she is suffering from sepsis.  She feels much more comfortable and on upon reassessment she has no tenderness to palpation.  We talked about the equivocal findings of possible diverticulitis, and she would prefer to proceed with empiric treatment which I think is reasonable.  She has a number of allergies listed but thinks she has taken Cipro successfully in the past but definitely states that she cannot take penicillins.  After having  my usual the risks and benefits discussion, I prescribed Cipro and Flagyl as well as Zofran.  I gave my usual and customary return precautions. [CF]    Clinical Course User Index [CF] Loleta Rose, MD [PR] Willy Eddy, MD     Medications  morphine (PF) 4 MG/ML injection 4 mg (4 mg Intravenous Given 06/07/23 0045)  ondansetron (ZOFRAN-ODT) disintegrating tablet 4 mg (4 mg Oral Given 06/06/23 1934)  sodium chloride 0.9 % bolus 1,000 mL (0 mLs Intravenous Stopped 06/07/23 0219)  iohexol (OMNIPAQUE) 350 MG/ML injection 100 mL (100 mLs Intravenous Contrast Given 06/06/23 2311)  lactated ringers bolus 1,000 mL (0 mLs Intravenous Stopped 06/07/23 0219)  ondansetron (ZOFRAN) injection 4 mg (4 mg Intravenous Given 06/07/23 0056)     ED Discharge Orders          Ordered    Ambulatory Referral to Primary Care (Establish Care)        06/07/23 0249    ciprofloxacin (CIPRO) 500 MG tablet  2 times daily        06/07/23 0251    metroNIDAZOLE (FLAGYL) 500 MG tablet  3 times daily        06/07/23 0251    ondansetron (ZOFRAN-ODT) 4 MG disintegrating tablet        06/07/23 0251           Final diagnoses:  Dehydration  Nausea vomiting and diarrhea  Acute diverticulitis     Loleta Rose, MD 06/07/23 [phone]

## 2023-06-07 LAB — LACTIC ACID, PLASMA: Lactic Acid, Venous: 1.8 mmol/L (ref 0.5–1.9)

## 2023-06-07 MED ORDER — ONDANSETRON HCL 4 MG/2ML IJ SOLN
4.0000 mg | INTRAMUSCULAR | Status: AC
  Administered 2023-06-07: 4 mg via INTRAVENOUS
  Filled 2023-06-07: qty 2

## 2023-06-07 MED ORDER — METRONIDAZOLE 500 MG PO TABS: 500.0000 mg | ORAL_TABLET | Freq: Three times a day (TID) | ORAL | 0 refills | Status: AC

## 2023-06-07 MED ORDER — ONDANSETRON 4 MG PO TBDP: ORAL_TABLET | ORAL | 0 refills | Status: DC

## 2023-06-07 MED ORDER — LACTATED RINGERS IV BOLUS
1000.0000 mL | Freq: Once | INTRAVENOUS | Status: AC
  Administered 2023-06-07: 1000 mL via INTRAVENOUS

## 2023-06-07 MED ORDER — CIPROFLOXACIN HCL 500 MG PO TABS: 500.0000 mg | ORAL_TABLET | Freq: Two times a day (BID) | ORAL | 0 refills | Status: AC

## 2023-06-07 NOTE — Discharge Instructions (Addendum)
We believe your symptoms are caused by diverticulitis.  Most of the time this condition (please read through the included information) can be cured with outpatient antibiotics.  Please take the full course of prescribed medication(s) and follow up with the doctors recommended above.  Return to the ED if your abdominal pain worsens or fails to improve, you develop bloody vomiting, bloody diarrhea, you are unable to tolerate fluids due to vomiting, fever greater than 101, or other symptoms that concern you.    

## 2023-07-19 LAB — LAB REPORT - SCANNED: EGFR: 84

## 2023-08-06 DIAGNOSIS — Z77120 Contact with and (suspected) exposure to mold (toxic): Secondary | ICD-10-CM | POA: Insufficient documentation

## 2023-08-06 DIAGNOSIS — E559 Vitamin D deficiency, unspecified: Secondary | ICD-10-CM | POA: Insufficient documentation

## 2023-08-06 DIAGNOSIS — M549 Dorsalgia, unspecified: Secondary | ICD-10-CM | POA: Insufficient documentation

## 2023-08-06 DIAGNOSIS — E630 Essential fatty acid [EFA] deficiency: Secondary | ICD-10-CM | POA: Insufficient documentation

## 2023-08-06 DIAGNOSIS — E538 Deficiency of other specified B group vitamins: Secondary | ICD-10-CM | POA: Insufficient documentation

## 2023-08-06 NOTE — Patient Instructions (Incomplete)

## 2023-08-08 ENCOUNTER — Ambulatory Visit: Payer: Medicare Other | Admitting: Nurse Practitioner

## 2023-08-08 ENCOUNTER — Encounter: Payer: Self-pay | Admitting: Nurse Practitioner

## 2023-08-08 VITALS — BP 126/82 | HR 77 | Temp 97.9°F | Ht 65.0 in | Wt 190.2 lb

## 2023-08-08 DIAGNOSIS — E66811 Obesity, class 1: Secondary | ICD-10-CM

## 2023-08-08 DIAGNOSIS — E669 Obesity, unspecified: Secondary | ICD-10-CM | POA: Insufficient documentation

## 2023-08-08 DIAGNOSIS — E782 Mixed hyperlipidemia: Secondary | ICD-10-CM | POA: Diagnosis not present

## 2023-08-08 DIAGNOSIS — R5383 Other fatigue: Secondary | ICD-10-CM

## 2023-08-08 DIAGNOSIS — M2550 Pain in unspecified joint: Secondary | ICD-10-CM

## 2023-08-08 DIAGNOSIS — G4733 Obstructive sleep apnea (adult) (pediatric): Secondary | ICD-10-CM | POA: Diagnosis not present

## 2023-08-08 DIAGNOSIS — E6609 Other obesity due to excess calories: Secondary | ICD-10-CM

## 2023-08-08 DIAGNOSIS — Z1231 Encounter for screening mammogram for malignant neoplasm of breast: Secondary | ICD-10-CM

## 2023-08-08 DIAGNOSIS — E559 Vitamin D deficiency, unspecified: Secondary | ICD-10-CM

## 2023-08-08 DIAGNOSIS — K449 Diaphragmatic hernia without obstruction or gangrene: Secondary | ICD-10-CM

## 2023-08-08 DIAGNOSIS — M545 Low back pain, unspecified: Secondary | ICD-10-CM

## 2023-08-08 DIAGNOSIS — A6920 Lyme disease, unspecified: Secondary | ICD-10-CM

## 2023-08-08 DIAGNOSIS — I10 Essential (primary) hypertension: Secondary | ICD-10-CM | POA: Diagnosis not present

## 2023-08-08 DIAGNOSIS — E538 Deficiency of other specified B group vitamins: Secondary | ICD-10-CM

## 2023-08-08 DIAGNOSIS — R109 Unspecified abdominal pain: Secondary | ICD-10-CM

## 2023-08-08 NOTE — Assessment & Plan Note (Signed)
Chronic and ongoing issue for years.  Will continue Naltrexone and refill as needed + could adjust if worsening pain.  Continue with chiropractic care.  Referral to physical therapy placed, may offer further benefit and advised her to do all stretches they teach at home as well.

## 2023-08-08 NOTE — Assessment & Plan Note (Signed)
Chronic, stable with Amlodipine.  Continue this medication regimen and adjust as needed.  Recent labs with integrative health will get scanned into system.  Recommend she monitor BP at least a few mornings a week at home and document.  DASH diet at home.  Labs today: up to date.

## 2023-08-08 NOTE — Assessment & Plan Note (Signed)
Has chronic, ongoing hip and back pain.  Diagnosed with Lyme disease in 2021 and treated at time, but has ongoing symptoms.  Refer to back pain plan of care for further.

## 2023-08-08 NOTE — Assessment & Plan Note (Signed)
Chronic, receives injections from integrative medicine.  Continue this treatment and adjust as needed.  Labs up to date with integrative medicine and will scan into chart.

## 2023-08-08 NOTE — Assessment & Plan Note (Signed)
No current medications.  Continue diet and exercise focus.  Would benefit lipid panel in future. Omega labs were done with integrative medicine.

## 2023-08-08 NOTE — Assessment & Plan Note (Signed)
BMI 31.65.  Recommended eating smaller high protein, low fat meals more frequently and exercising 30 mins a day 5 times a week with a goal of 10-15lb weight loss in the next 3 months. Patient voiced their understanding and motivation to adhere to these recommendations.

## 2023-08-08 NOTE — Assessment & Plan Note (Signed)
Diagnosed in past per patient, no CPAP.  Elevates head of her bed which improves symptoms.

## 2023-08-08 NOTE — Assessment & Plan Note (Addendum)
Started over the summer 2024, ?related to past Lyme Disease as does have GI issues and SOB at times as well or related to chronic pain.  Labs up to date with integrative medicine and will scan into chart. Would benefit thyroid check and possibly ANA to see if autoimmune disease present.  Recommend Lisa Hurley work on meditation and relaxation daily + look into Qigong.  Start Omega 3 supplement and continue B12 and Vitamin D.  Could consider Wellbutrin trial in future, discussed with her.  Recommended Lisa Hurley trial an anti inflammatory diet to see if benefit to her symptoms.

## 2023-08-08 NOTE — Assessment & Plan Note (Signed)
Ongoing for 2 years, is scheduled to see GI upcoming which will be beneficial for further assessment.  May benefit colonoscopy to further assess, but some fears present there due to a sister who coded on table during colonoscopy.  Labs up to date with integrative medicine and will scan into chart. ?IBS issues.  Discussed with her to work on an anti inflammatory diet, which she can find information online on -- this may benefit GI, back pain, and fatigue symptoms.

## 2023-08-08 NOTE — Assessment & Plan Note (Signed)
Chronic, stable.  B12 level slightly above goal on her recent check.  Continue B12 injections, she receives from integrative medicine.  Could obtain here in future. Labs up to date with integrative medicine and will scan into chart.

## 2023-08-08 NOTE — Progress Notes (Addendum)
New Patient Office Visit  Subjective    Patient ID: Lisa Hurley, female    DOB: 09-Mar-1956  Age: 67 y.o. MRN: [id number]  CC:  Chief Complaint  Patient presents with   Back Pain    Pt states she has a lot of mid back pain states its the biggest concern that hasn't been addressed yet    Fatigue    HPI Lisa Hurley presents for new patient visit to establish care.  Introduced to Publishing rights manager role and practice setting.  All questions answered.  Discussed provider/patient relationship and expectations.  Currently sees integrative health nurse practitioner -- brought in recent labs from them.    No sense of smell since Covid.  HYPERTENSION / HYPERLIPIDEMIA Currently taking Amlodipine 10 MG daily.  Has OSA, does not use CPAP as masks kept her awake.  Has a bed that elevates had of bed which helps.  Sleeps well and has dreams, sleeping deeper.  Had echo in April 2022 -- EF >55%. Hiatal hernia noted on past imaging. Satisfied with current treatment? yes Duration of hypertension: chronic BP monitoring frequency: not checking BP range:  BP medication side effects: no Duration of hyperlipidemia: chronic Cholesterol supplements: none Aspirin: no Recent stressors: no Recurrent headaches: no Visual changes: no Palpitations: no Dyspnea:  as below Chest pain: as below Lower extremity edema: no Dizzy/lightheaded: yes -- notices if takes probiotic or Vitamin D   FATIGUE Started over the summer -- gets fatigue, SOB, and sweating. Has underlying Lyme Disease -- diagnosed in 2021 by integrative medicine provider and treated with Doxycyline.  Has never been to rheumatology.  Gets B12 and Vitamin D shots every 2 weeks.  Can not take oral Vitamin D, reacts to this.  Has had issues with abdominal discomfort for 2 years, did see GI at [facility] in December 2023. Has never had a colonoscopy or endoscopy.  Has a history of sister coding on table during colonoscopy.  Scheduled to see GI at  [facility] upcoming.   Duration:  months Severity: 8/10  Onset: gradual Context when symptoms started:   Lyme Disease Symptoms improve with rest: yes  Depressive symptoms: no Stress/anxiety: no Insomnia: no Snoring: not anymore Observed apnea by bed partner: no Daytime hypersomnolence:no Wakes feeling refreshed: never been one to wake up and jump out of bed History of sleep study: yes Dysnea on exertion:  walking across room or doing chores Orthopnea/PND: no Chest pain:  tightness in chest at times Chronic cough: no Lower extremity edema: no Arthralgias:yes Myalgias: no Weakness: yes Rash: no     08/08/2023   10:34 AM 08/08/2023    9:49 AM  Depression screen PHQ 2/9  Decreased Interest 0 0  Down, Depressed, Hopeless 0 0  PHQ - 2 Score 0 0  Altered sleeping 0 2  Tired, decreased energy 3 3  Change in appetite 3 3  Feeling bad or failure about yourself  0 0  Trouble concentrating 0 2  Moving slowly or fidgety/restless 0 0  Suicidal thoughts 0 0  PHQ-9 Score 6 10  Difficult doing work/chores Not difficult at all Not difficult at all       08/08/2023    9:49 AM  GAD 7 : Generalized Anxiety Score  Nervous, Anxious, on Edge 0  Control/stop worrying 0  Worry too much - different things 0  Trouble relaxing 0  Restless 0  Easily annoyed or irritable 0  Afraid - awful might happen 0  Total GAD 7 Score 0  Anxiety Difficulty Not difficult at all   BACK PAIN Goes to chiropractor for hip and back pain.  Does not seem to be helping mid-back as much now.  Has had back and hip pain for years, sat in front of computer for 40 years.  Sees chiropractor once a month.  Imaging in past with chiropractor.  Naltrexone for pain which offers benefit at times.   Duration: months Mechanism of injury: unknown Location: lower back and thoracic area Onset: gradual Severity: 4-8 mid back Quality: dull and aching Frequency: constant Radiation: not usually from lower back Aggravating  factors: lifting and movement Alleviating factors: lying down and a bed that has elevation aspect Status: fluctuating Treatments attempted: chiropractor, heat, ice, Advil,  lying down Relief with NSAIDs?: moderate Nighttime pain:  no Paresthesias / decreased sensation:  no Bowel / bladder incontinence:  no Fevers:  no Dysuria / urinary frequency:  no    Outpatient Encounter Medications as of 08/08/2023  Medication Sig   amLODipine (NORVASC) 10 MG tablet Take 10 mg by mouth daily.   loratadine (CLARITIN) 10 MG tablet Take 10 mg by mouth daily as needed for allergies.   Naltrexone HCl, Pain, 4.5 MG CAPS Take 1 capsule by mouth daily.   [DISCONTINUED] EPINEPHrine (EPIPEN 2-PAK) 0.3 mg/0.3 mL IJ SOAJ injection Inject 0.3 mg into the muscle as needed.   [DISCONTINUED] dicyclomine (BENTYL) 10 MG capsule Take 1 capsule (10 mg total) by mouth 4 (four) times daily -  before meals and at bedtime.   [DISCONTINUED] doxycycline (VIBRAMYCIN) 100 MG capsule Take 100 mg by mouth 2 (two) times daily. Taking for 2 months and is on week 2.   [DISCONTINUED] hydrochlorothiazide (HYDRODIURIL) 12.5 MG tablet Take 1 tablet by mouth daily. (Patient not taking: Reported on 08/08/2023)   [DISCONTINUED] loperamide (IMODIUM A-D) 2 MG tablet Take 1 tablet (2 mg total) by mouth 4 (four) times daily as needed for diarrhea or loose stools.   [DISCONTINUED] ondansetron (ZOFRAN-ODT) 4 MG disintegrating tablet Allow 1-2 tablets to dissolve in your mouth every 8 hours as needed for nausea/vomiting   [DISCONTINUED] pantoprazole (PROTONIX) 40 MG tablet Take 1 tablet (40 mg total) by mouth daily.   [DISCONTINUED] prochlorperazine (COMPAZINE) 5 MG tablet Take 1 tablet (5 mg total) by mouth every 8 (eight) hours as needed for up to 5 days for nausea or vomiting.   [DISCONTINUED] zinc gluconate 50 MG tablet Take 30 mg by mouth daily.   No facility-administered encounter medications on file as of 08/08/2023.    Past Medical History:   Diagnosis Date   Allergy 1970s   Arthritis    Asthma    Depression    GERD (gastroesophageal reflux disease)    Hypertension    Lyme disease    Sleep apnea     Past Surgical History:  Procedure Laterality Date   ABDOMINAL HYSTERECTOMY      Family History  Problem Relation Age of Onset   Arthritis Mother    COPD Mother    Early death Father    Arthritis Maternal Grandmother    Stroke Maternal Grandmother    Asthma Sister    Hyperlipidemia Sister    Asthma Daughter    Hyperlipidemia Brother     Social History   Socioeconomic History   Marital status: Single    Spouse name: Not on file   Number of children: Not on file   Years of education: Not on file   Highest education level: Not on file  Occupational  History   Not on file  Tobacco Use   Smoking status: Never   Smokeless tobacco: Never  Substance and Sexual Activity   Alcohol use: Never   Drug use: Never   Sexual activity: Not Currently    Birth control/protection: Abstinence  Other Topics Concern   Not on file  Social History Narrative   Not on file   Social Determinants of Health   Financial Resource Strain: Low Risk  (08/08/2023)   Overall Financial Resource Strain (CARDIA)    Difficulty of Paying Living Expenses: Not hard at all  Food Insecurity: No Food Insecurity (08/08/2023)   Hunger Vital Sign    Worried About Running Out of Food in the Last Year: Never true    Ran Out of Food in the Last Year: Never true  Transportation Needs: No Transportation Needs (08/08/2023)   PRAPARE - Administrator, Civil Service (Medical): No    Lack of Transportation (Non-Medical): No  Physical Activity: Insufficiently Active (08/08/2023)   Exercise Vital Sign    Days of Exercise per Week: 2 days    Minutes of Exercise per Session: 20 min  Stress: No Stress Concern Present (08/08/2023)   [employer] of Occupational Health - Occupational Stress Questionnaire    Feeling of Stress : Not at all   Social Connections: Moderately Isolated (08/08/2023)   Social Connection and Isolation Panel [NHANES]    Frequency of Communication with Friends and Family: Twice a week    Frequency of Social Gatherings with Friends and Family: Twice a week    Attends Religious Services: More than 4 times per year    Active Member of [employer] or Organizations: No    Attends Banker Meetings: Never    Marital Status: Divorced  Catering manager Violence: Not At Risk (08/08/2023)   Humiliation, Afraid, Rape, and Kick questionnaire    Fear of Current or Ex-Partner: No    Emotionally Abused: No    Physically Abused: No    Sexually Abused: No    Review of Systems  Constitutional:  Positive for malaise/fatigue. Negative for chills, diaphoresis, fever and weight loss.  Respiratory:  Positive for shortness of breath. Negative for cough and wheezing.   Cardiovascular:  Positive for chest pain (tightness at times). Negative for palpitations, orthopnea and leg swelling.  Gastrointestinal:  Positive for abdominal pain. Negative for constipation, diarrhea, heartburn, nausea and vomiting.  Musculoskeletal:  Positive for back pain.  Neurological:  Positive for weakness. Negative for dizziness.  Psychiatric/Behavioral: Negative.         Objective    BP 126/82   Pulse 77   Temp 97.9 F (36.6 C) (Oral)   Ht 5\' 5"  (1.651 m)   Wt 190 lb 3.2 oz (86.3 kg)   SpO2 98%   BMI 31.65 kg/m   Physical Exam Vitals and nursing note reviewed.  Constitutional:      General: She is awake. She is not in acute distress.    Appearance: She is well-developed and well-groomed. She is obese. She is not ill-appearing or toxic-appearing.  HENT:     Head: Normocephalic.     Right Ear: Hearing and external ear normal.     Left Ear: Hearing and external ear normal.  Eyes:     General: Lids are normal.        Right eye: No discharge.        Left eye: No discharge.     Conjunctiva/sclera: Conjunctivae normal.  Pupils: Pupils are equal, round, and reactive to light.  Neck:     Thyroid: No thyromegaly.     Vascular: No carotid bruit.  Cardiovascular:     Rate and Rhythm: Normal rate and regular rhythm.     Heart sounds: Normal heart sounds. No murmur heard.    No gallop.  Pulmonary:     Effort: Pulmonary effort is normal. No accessory muscle usage or respiratory distress.     Breath sounds: Normal breath sounds.  Abdominal:     General: Bowel sounds are normal. There is no distension.     Palpations: Abdomen is soft.     Tenderness: There is no abdominal tenderness.  Musculoskeletal:     Cervical back: Normal range of motion and neck supple.     Thoracic back: Tenderness (to left lateral) present. No swelling. Normal range of motion.     Lumbar back: No swelling or tenderness. Normal range of motion.     Right lower leg: No edema.     Left lower leg: No edema.  Lymphadenopathy:     Cervical: No cervical adenopathy.  Skin:    General: Skin is warm and dry.  Neurological:     Mental Status: She is alert and oriented to person, place, and time.     Deep Tendon Reflexes: Reflexes are normal and symmetric.     Reflex Scores:      Brachioradialis reflexes are 2+ on the right side and 2+ on the left side.      Patellar reflexes are 2+ on the right side and 2+ on the left side. Psychiatric:        Attention and Perception: Attention normal.        Mood and Affect: Mood normal.        Speech: Speech normal.        Behavior: Behavior normal. Behavior is cooperative.        Thought Content: Thought content normal.    Last CBC Lab Results  Component Value Date   WBC 9.4 06/06/2023   HGB 15.2 (H) 06/06/2023   HCT 43.8 06/06/2023   MCV 88.1 06/06/2023   MCH 30.6 06/06/2023   RDW 12.0 06/06/2023   PLT 275 06/06/2023   Last metabolic panel Lab Results  Component Value Date   GLUCOSE 166 (H) 06/06/2023   NA 133 (L) 06/06/2023   K 3.6 06/06/2023   CL 102 06/06/2023   CO2 19 (L)  06/06/2023   BUN 11 06/06/2023   CREATININE 0.73 06/06/2023   GFRNONAA >60 06/06/2023   CALCIUM 9.1 06/06/2023   PROT 7.4 11/23/2022   ALBUMIN 4.0 11/23/2022   BILITOT 0.9 11/23/2022   ALKPHOS 84 11/23/2022   AST 25 11/23/2022   ALT 19 11/23/2022   ANIONGAP 12 06/06/2023   Last thyroid functions Lab Results  Component Value Date   TSH 0.528 12/21/2020      Assessment & Plan:   Problem List Items Addressed This Visit       Cardiovascular and Mediastinum   Essential hypertension    Chronic, stable with Amlodipine.  Continue this medication regimen and adjust as needed.  Recent labs with integrative health will get scanned into system.  Recommend she monitor BP at least a few mornings a week at home and document.  DASH diet at home.  Labs today: up to date.          Respiratory   Hiatal hernia    Noted on past imaging.  Monitor  for increased GERD symptoms.  She is scheduled to see GI upcoming.  Recommend avoiding Ibuprofen products.      OSA (obstructive sleep apnea)    Diagnosed in past per patient, no CPAP.  Elevates head of her bed which improves symptoms.        Other   Abdominal discomfort    Ongoing for 2 years, is scheduled to see GI upcoming which will be beneficial for further assessment.  May benefit colonoscopy to further assess, but some fears present there due to a sister who coded on table during colonoscopy.  Labs up to date with integrative medicine and will scan into chart. ?IBS issues.  Discussed with her to work on an anti inflammatory diet, which she can find information online on -- this may benefit GI, back pain, and fatigue symptoms.      Back pain - Primary    Chronic and ongoing issue for years.  Will continue Naltrexone and refill as needed + could adjust if worsening pain.  Continue with chiropractic care.  Referral to physical therapy placed, may offer further benefit and advised her to do all stretches they teach at home as well.         Relevant Medications   Naltrexone HCl, Pain, 4.5 MG CAPS   Other Relevant Orders   Ambulatory referral to Physical Therapy   Fatigue    Started over the summer 2024, ?related to past Lyme Disease as does have GI issues and SOB at times as well or related to chronic pain.  Labs up to date with integrative medicine and will scan into chart. Would benefit thyroid check and possibly ANA to see if autoimmune disease present.  Recommend she work on meditation and relaxation daily + look into Qigong.  Start Omega 3 supplement and continue B12 and Vitamin D.  Could consider Wellbutrin trial in future, discussed with her.  Recommended she trial an anti inflammatory diet to see if benefit to her symptoms.      Hyperlipidemia    No current medications.  Continue diet and exercise focus.  Would benefit lipid panel in future. Omega labs were done with integrative medicine.      Joint pain due to Lyme disease    Has chronic, ongoing hip and back pain.  Diagnosed with Lyme disease in 2021 and treated at time, but has ongoing symptoms.  Refer to back pain plan of care for further.      Obesity    BMI 31.65.  Recommended eating smaller high protein, low fat meals more frequently and exercising 30 mins a day 5 times a week with a goal of 10-15lb weight loss in the next 3 months. Patient voiced their understanding and motivation to adhere to these recommendations.       Vitamin B12 deficiency    Chronic, stable.  B12 level slightly above goal on her recent check.  Continue B12 injections, she receives from integrative medicine.  Could obtain here in future. Labs up to date with integrative medicine and will scan into chart.      Vitamin D deficiency    Chronic, receives injections from integrative medicine.  Continue this treatment and adjust as needed.  Labs up to date with integrative medicine and will scan into chart.      Other Visit Diagnoses     Encounter for screening mammogram for malignant  neoplasm of breast       Mammogram ordered and provided instructions on how to schedule.  Relevant Orders   MM 3D SCREENING MAMMOGRAM BILATERAL BREAST       Return in about 2 months (around 10/08/2023) for BACK PAIN.   Marjie Skiff, NP

## 2023-08-08 NOTE — Assessment & Plan Note (Signed)
Noted on past imaging.  Monitor for increased GERD symptoms.  She is scheduled to see GI upcoming.  Recommend avoiding Ibuprofen products.

## 2023-09-05 ENCOUNTER — Ambulatory Visit
Admission: RE | Admit: 2023-09-05 | Discharge: 2023-09-05 | Disposition: A | Discharge status: Home / Self Care | Payer: Medicare Other | Source: Ambulatory Visit | Attending: Nurse Practitioner | Admitting: Nurse Practitioner

## 2023-09-05 ENCOUNTER — Encounter: Payer: Self-pay | Admitting: Radiology

## 2023-09-05 DIAGNOSIS — Z1231 Encounter for screening mammogram for malignant neoplasm of breast: Secondary | ICD-10-CM | POA: Diagnosis present

## 2023-09-07 ENCOUNTER — Inpatient Hospital Stay
Admission: RE | Admit: 2023-09-07 | Discharge: 2023-09-07 | Disposition: A | Discharge status: Home / Self Care | Payer: Self-pay | Source: Ambulatory Visit | Attending: Nurse Practitioner | Admitting: Nurse Practitioner

## 2023-09-07 ENCOUNTER — Other Ambulatory Visit: Payer: Self-pay | Admitting: *Deleted

## 2023-09-07 DIAGNOSIS — Z1231 Encounter for screening mammogram for malignant neoplasm of breast: Secondary | ICD-10-CM

## 2023-09-07 NOTE — Progress Notes (Signed)
Contacted via MyChart

## 2023-09-08 ENCOUNTER — Other Ambulatory Visit: Payer: Self-pay | Admitting: Nurse Practitioner

## 2023-09-08 DIAGNOSIS — R928 Other abnormal and inconclusive findings on diagnostic imaging of breast: Secondary | ICD-10-CM

## 2023-09-13 ENCOUNTER — Ambulatory Visit
Admission: RE | Admit: 2023-09-13 | Discharge: 2023-09-13 | Disposition: A | Discharge status: Home / Self Care | Payer: Medicare Other | Source: Ambulatory Visit | Attending: Nurse Practitioner | Admitting: Nurse Practitioner

## 2023-09-13 DIAGNOSIS — R928 Other abnormal and inconclusive findings on diagnostic imaging of breast: Secondary | ICD-10-CM | POA: Insufficient documentation

## 2023-11-04 NOTE — Patient Instructions (Signed)
 Please call to schedule your mammogram and/or bone density: [facility] at [facility]  Address: [address], [place], [place] [address] Phone: [phone]  [facility] [address] [place],  [place]  [address] Phone: [phone]    Healthy Eating, Adult Healthy eating may help you get and keep a healthy body weight, reduce the risk of chronic disease, and live a long and productive life. It is important to follow a healthy eating pattern. Your nutritional and calorie needs should be met mainly by different nutrient-rich foods. What are tips for following this plan? Reading food labels Read labels and choose the following: Reduced or low sodium products. Juices with 100% fruit juice. Foods with low saturated fats (<3 g per serving) and high polyunsaturated and monounsaturated fats. Foods with whole grains, such as whole wheat, cracked wheat, brown rice, and wild rice. Whole grains that are fortified with folic acid. This is recommended for females who are pregnant or who want to become pregnant. Read labels and do not eat or drink the following: Foods or drinks with added sugars. These include foods that contain brown sugar, corn sweetener, corn syrup, dextrose, fructose, glucose, high-fructose corn syrup, honey, invert sugar, lactose, malt syrup, maltose, molasses, raw sugar, sucrose, trehalose, or turbinado sugar. Limit your intake of added sugars to less than 10% of your total daily calories. Do not eat more than the following amounts of added sugar per day: 6 teaspoons (25 g) for females. 9 teaspoons (38 g) for males. Foods that contain processed or refined starches and grains. Refined grain products, such as white flour, degermed cornmeal, white bread, and white rice. Shopping Choose nutrient-rich snacks, such as vegetables, whole fruits, and nuts. Avoid high-calorie and high-sugar snacks, such as potato chips,  fruit snacks, and candy. Use oil-based dressings and spreads on foods instead of solid fats such as butter, margarine, sour cream, or cream cheese. Limit pre-made sauces, mixes, and instant products such as flavored rice, instant noodles, and ready-made pasta. Try more plant-protein sources, such as tofu, tempeh, black beans, edamame, lentils, nuts, and seeds. Explore eating plans such as the Mediterranean diet or vegetarian diet. Try heart-healthy dips made with beans and healthy fats like hummus and guacamole. Vegetables go great with these. Cooking Use oil to saut or stir-fry foods instead of solid fats such as butter, margarine, or lard. Try baking, boiling, grilling, or broiling instead of frying. Remove the fatty part of meats before cooking. Steam vegetables in water or broth. Meal planning  At meals, imagine dividing your plate into fourths: One-half of your plate is fruits and vegetables. One-fourth of your plate is whole grains. One-fourth of your plate is protein, especially lean meats, poultry, eggs, tofu, beans, or nuts. Include low-fat dairy as part of your daily diet. Lifestyle Choose healthy options in all settings, including home, work, school, restaurants, or stores. Prepare your food safely: Wash your hands after handling raw meats. Where you prepare food, keep surfaces clean by regularly washing with hot, soapy water. Keep raw meats separate from ready-to-eat foods, such as fruits and vegetables. Cook seafood, meat, poultry, and eggs to the recommended temperature. Get a food thermometer. Store foods at safe temperatures. In general: Keep cold foods at 43F (4.4C) or below. Keep hot foods at 143F (60C) or above. Keep your freezer at [facility] (-17.8C) or below. Foods are not safe to eat if they have been between the temperatures of 40-143F (4.4-60C) for more  than 2 hours. What foods should I eat? Fruits Aim to eat 1-2 cups of fresh, canned (in natural juice),  or frozen fruits each day. One cup of fruit equals 1 small apple, 1 large banana, 8 large strawberries, 1 cup (237 g) canned fruit,  cup (82 g) dried fruit, or 1 cup (240 mL) 100% juice. Vegetables Aim to eat 2-4 cups of fresh and frozen vegetables each day, including different varieties and colors. One cup of vegetables equals 1 cup (91 g) broccoli or cauliflower florets, 2 medium carrots, 2 cups (150 g) raw, leafy greens, 1 large tomato, 1 large bell pepper, 1 large sweet potato, or 1 medium white potato. Grains Aim to eat 5-10 ounce-equivalents of whole grains each day. Examples of 1 ounce-equivalent of grains include 1 slice of bread, 1 cup (40 g) ready-to-eat cereal, 3 cups (24 g) popcorn, or  cup (93 g) cooked rice. Meats and other proteins Try to eat 5-7 ounce-equivalents of protein each day. Examples of 1 ounce-equivalent of protein include 1 egg,  oz nuts (12 almonds, 24 pistachios, or 7 walnut halves), 1/4 cup (90 g) cooked beans, 6 tablespoons (90 g) hummus or 1 tablespoon (16 g) peanut butter. A cut of meat or fish that is the size of a deck of cards is about 3-4 ounce-equivalents (85 g). Of the protein you eat each week, try to have at least 8 sounce (227 g) of seafood. This is about 2 servings per week. This includes salmon, trout, herring, sardines, and anchovies. Dairy Aim to eat 3 cup-equivalents of fat-free or low-fat dairy each day. Examples of 1 cup-equivalent of dairy include 1 cup (240 mL) milk, 8 ounces (250 g) yogurt, 1 ounces (44 g) natural cheese, or 1 cup (240 mL) fortified soy milk. Fats and oils Aim for about 5 teaspoons (21 g) of fats and oils per day. Choose monounsaturated fats, such as canola and olive oils, mayonnaise made with olive oil or avocado oil, avocados, peanut butter, and most nuts, or polyunsaturated fats, such as sunflower, corn, and soybean oils, walnuts, pine nuts, sesame seeds, sunflower seeds, and flaxseed. Beverages Aim for 6 eight-ounce glasses of  water per day. Limit coffee to 3-5 eight-ounce cups per day. Limit caffeinated beverages that have added calories, such as soda and energy drinks. If you drink alcohol: Limit how much you have to: 0-1 drink a day if you are female. 0-2 drinks a day if you are female. Know how much alcohol is in your drink. In the [place] one drink is one 12 oz bottle of beer (355 mL), one 5 oz glass of wine (148 mL), or one 1 oz glass of hard liquor (44 mL). Seasoning and other foods Try not to add too much salt to your food. Try using herbs and spices instead of salt. Try not to add sugar to food. This information is based on [place] nutrition guidelines. To learn more, visit [employer]. Exact amounts may vary. You may need different amounts. This information is not intended to replace advice given to you by your health care provider. Make sure you discuss any questions you have with your health care provider. Document Revised: 07/25/2022 Document Reviewed: 07/25/2022 Elsevier Patient Education  2024 [employer].

## 2023-11-09 ENCOUNTER — Encounter: Payer: Self-pay | Admitting: Nurse Practitioner

## 2023-11-09 ENCOUNTER — Ambulatory Visit: Payer: Medicare Other | Admitting: Nurse Practitioner

## 2023-11-09 VITALS — BP 114/72 | HR 82 | Temp 97.5°F | Resp 18 | Ht 65.3 in | Wt 190.0 lb

## 2023-11-09 DIAGNOSIS — E559 Vitamin D deficiency, unspecified: Secondary | ICD-10-CM

## 2023-11-09 DIAGNOSIS — E785 Hyperlipidemia, unspecified: Secondary | ICD-10-CM | POA: Diagnosis not present

## 2023-11-09 DIAGNOSIS — E538 Deficiency of other specified B group vitamins: Secondary | ICD-10-CM | POA: Diagnosis not present

## 2023-11-09 DIAGNOSIS — Z2821 Immunization not carried out because of patient refusal: Secondary | ICD-10-CM

## 2023-11-09 DIAGNOSIS — Z780 Asymptomatic menopausal state: Secondary | ICD-10-CM | POA: Diagnosis not present

## 2023-11-09 DIAGNOSIS — Z0000 Encounter for general adult medical examination without abnormal findings: Secondary | ICD-10-CM | POA: Diagnosis not present

## 2023-11-09 DIAGNOSIS — E6609 Other obesity due to excess calories: Secondary | ICD-10-CM

## 2023-11-09 DIAGNOSIS — I10 Essential (primary) hypertension: Secondary | ICD-10-CM

## 2023-11-09 DIAGNOSIS — R7303 Prediabetes: Secondary | ICD-10-CM | POA: Diagnosis not present

## 2023-11-09 DIAGNOSIS — E669 Obesity, unspecified: Secondary | ICD-10-CM

## 2023-11-09 DIAGNOSIS — E039 Hypothyroidism, unspecified: Secondary | ICD-10-CM

## 2023-11-09 DIAGNOSIS — Z1159 Encounter for screening for other viral diseases: Secondary | ICD-10-CM

## 2023-11-09 DIAGNOSIS — E782 Mixed hyperlipidemia: Secondary | ICD-10-CM

## 2023-11-09 HISTORY — DX: Hypothyroidism, unspecified: E03.9

## 2023-11-09 NOTE — Assessment & Plan Note (Signed)
 Chronic, stable.  Continue B12 injections, she receives from integrative medicine.  Could obtain here in future. Labs: B12 and CBC.

## 2023-11-09 NOTE — Assessment & Plan Note (Signed)
 Chronic, receives injections from integrative medicine.  Continue this treatment and adjust as needed.  Labs: Vit D.

## 2023-11-09 NOTE — Assessment & Plan Note (Signed)
 Refuses Flu, PCV20, and Shingrix.  Also refuses colonoscopy or colon cancer screening.

## 2023-11-09 NOTE — Assessment & Plan Note (Signed)
 Chronic.  No current medications.  Continue diet and exercise focus.  Lipid panel today. Omega labs were done with integrative medicine.

## 2023-11-09 NOTE — Assessment & Plan Note (Signed)
 Diagnosed by her integrative medicine provider, they order her [place] Thyroid.  Will recheck labs today and discussed with patient she can share these with that provider via MyChart.  Continue this collaboration.

## 2023-11-09 NOTE — Assessment & Plan Note (Signed)
 Noted on past labs, check A1c today.  Continue diet and exercise focus at this time, start medication only as needed.

## 2023-11-09 NOTE — Assessment & Plan Note (Signed)
 Chronic, stable with BP at goal.  Continue current medication regimen and adjust as needed.  Recent labs with integrative health scanned into system.  Recommend she monitor BP at least a few mornings a week at home and document.  DASH diet at home.  Labs today: CBC, CMP, TSH.

## 2023-11-09 NOTE — Progress Notes (Signed)
 BP 114/72   Pulse 82   Temp (!) 97.5 F (36.4 C) (Oral)   Resp 18   Ht 5' 5.3 (1.659 m)   Wt 190 lb (86.2 kg)   SpO2 95%   BMI 31.33 kg/m    Subjective:    Patient ID: Lisa Hurley, female    DOB: 01-03-1956, 68 y.o.   MRN: [id number]  HPI: Lisa Hurley is a 68 y.o. female presenting on 11/09/2023 for Medicare Wellness examination. Current medical complaints include:none  She currently lives with: grandson Menopausal Symptoms: no  HYPERTENSION / HYPERLIPIDEMIA Currently taking Amlodipine  10 MG daily.  Had echo in April 2022 -- EF >55%. Hiatal hernia noted on past imaging.Has OSA, does not use CPAP as masks kept her awake.  Has a bed that elevates had of bed which helps.    Continues B12 and Vitamin D  supplements as ordered by her integrative medicine provider.  Satisfied with current treatment? yes Duration of hypertension: chronic BP monitoring frequency: not checking BP range:  BP medication side effects: no Duration of hyperlipidemia: chronic Cholesterol supplements: none Aspirin: no Recent stressors: no Recurrent headaches: no Visual changes: no Palpitations: no Dyspnea:  as below Chest pain: as below Lower extremity edema: no Dizzy/lightheaded: no  HYPOTHYROIDISM Takes Armour Thyroid as ordered by integrative medicine doctor. Thyroid control status:stable Satisfied with current treatment? yes Medication side effects: no Medication compliance: good compliance Etiology of hypothyroidism: unknown Recent dose adjustment:no Fatigue: yes Cold intolerance: occasional Heat intolerance: no Weight gain: no Weight loss: no Constipation: yes Diarrhea/loose stools: no Palpitations: no Lower extremity edema: no Anxiety/depressed mood: no   Depression Screen done today and results listed below:     11/09/2023    9:44 AM 08/08/2023   10:34 AM 08/08/2023    9:49 AM  Depression screen PHQ 2/9  Decreased Interest 0 0 0  Down, Depressed, Hopeless 0 0 0  PHQ -  2 Score 0 0 0  Altered sleeping 1 0 2  Tired, decreased energy 2 3 3   Change in appetite 2 3 3   Feeling bad or failure about yourself  0 0 0  Trouble concentrating 2 0 2  Moving slowly or fidgety/restless 0 0 0  Suicidal thoughts 0 0 0  PHQ-9 Score 7 6 10   Difficult doing work/chores Not difficult at all Not difficult at all Not difficult at all      11/09/2023    9:46 AM 08/08/2023    9:49 AM  GAD 7 : Generalized Anxiety Score  Nervous, Anxious, on Edge 0 0  Control/stop worrying 0 0  Worry too much - different things 0 0  Trouble relaxing 0 0  Restless 0 0  Easily annoyed or irritable 1 0  Afraid - awful might happen 0 0  Total GAD 7 Score 1 0  Anxiety Difficulty Not difficult at all Not difficult at all      12/24/2020    2:00 AM 12/24/2020   10:00 AM 08/08/2023    9:49 AM 11/08/2023    6:08 PM 11/09/2023    9:42 AM  Fall Risk  Falls in the past year?   0 0 0  Was there an injury with Fall?   0  0  Fall Risk Category Calculator   0  0  (RETIRED) Patient Fall Risk Level Low fall risk Low fall risk     Patient at Risk for Falls Due to   No Fall Risks  No Fall Risks  Fall risk Follow  up   Falls evaluation completed  Falls evaluation completed    Functional Status Survey: Is the patient deaf or have difficulty hearing?: (Patient-Rptd) No Does the patient have difficulty seeing, even when wearing glasses/contacts?: (Patient-Rptd) No Does the patient have difficulty concentrating, remembering, or making decisions?: (Patient-Rptd) Yes Does the patient have difficulty walking or climbing stairs?: (Patient-Rptd) Yes Does the patient have difficulty dressing or bathing?: (Patient-Rptd) No Does the patient have difficulty doing errands alone such as visiting a doctor's office or shopping?: (Patient-Rptd) No   A voluntary discussion about advance care planning including the explanation and discussion of advance directives was extensively discussed  with the patient for 15 minutes with  patient.  Explanation about the health care proxy and living will was reviewed and packet with forms with explanation of how to fill them out was given.  During this discussion, the patient was able to identify a health care proxy as daughter and plans/does not plan to fill out the paperwork required.  Patient was offered a separate Advance Care Planning visit for further assistance with forms.      Past Medical History:  Past Medical History:  Diagnosis Date   Allergy 1970s   Arthritis    Asthma    Depression    GERD (gastroesophageal reflux disease)    Hypertension    Hypothyroidism 11/09/2023   Lyme disease    Sleep apnea     Surgical History:  Past Surgical History:  Procedure Laterality Date   ABDOMINAL HYSTERECTOMY      Medications:  Current Outpatient Medications on File Prior to Visit  Medication Sig   amLODipine  (NORVASC ) 10 MG tablet Take 10 mg by mouth daily.   ARMOUR THYROID 15 MG tablet Take 15 mg by mouth daily.   cyanocobalamin (VITAMIN B12) 100 MCG tablet Take 100 mcg by mouth every other day.   loratadine  (CLARITIN ) 10 MG tablet Take 10 mg by mouth daily as needed for allergies.   Naltrexone HCl, Pain, 4.5 MG CAPS Take 1 capsule by mouth daily.   Omega-3 Fatty Acids (OMEGA-3 PO) Take 200 mg by mouth daily at 2 PM.   UNABLE TO FIND Take 1 tablet by mouth every other day. Vitamin K2, Vitamin K1, Magnesium, and Zinc   No current facility-administered medications on file prior to visit.    Allergies:  Allergies  Allergen Reactions   Budesonide-Formoterol Fumarate Swelling   Amoxicillin    Estrogens    Levofloxacin    Lisinopril     Cannot remember   Macrolides And Ketolides    Metronidazole  Nausea Only   Penicillins    Sulfa Antibiotics     Social History:  Social History   Socioeconomic History   Marital status: Single    Spouse name: Not on file   Number of children: Not on file   Years of education: Not on file   Highest education level: Not on  file  Occupational History   Not on file  Tobacco Use   Smoking status: Never   Smokeless tobacco: Never  Vaping Use   Vaping status: Never Used  Substance and Sexual Activity   Alcohol use: Never   Drug use: Never   Sexual activity: Not Currently    Birth control/protection: Abstinence  Other Topics Concern   Not on file  Social History Narrative   Not on file   Social Drivers of Health   Financial Resource Strain: Low Risk  (11/09/2023)   Overall Financial Resource Strain (CARDIA)  Difficulty of Paying Living Expenses: Not hard at all  Food Insecurity: No Food Insecurity (11/09/2023)   Hunger Vital Sign    Worried About Running Out of Food in the Last Year: Never true    Ran Out of Food in the Last Year: Never true  Transportation Needs: No Transportation Needs (11/09/2023)   PRAPARE - Administrator, Civil Service (Medical): No    Lack of Transportation (Non-Medical): No  Physical Activity: Insufficiently Active (11/09/2023)   Exercise Vital Sign    Days of Exercise per Week: 2 days    Minutes of Exercise per Session: 20 min  Stress: No Stress Concern Present (11/09/2023)   [employer] of Occupational Health - Occupational Stress Questionnaire    Feeling of Stress : Not at all  Social Connections: Moderately Isolated (11/09/2023)   Social Connection and Isolation Panel [NHANES]    Frequency of Communication with Friends and Family: Twice a week    Frequency of Social Gatherings with Friends and Family: Twice a week    Attends Religious Services: More than 4 times per year    Active Member of [employer] or Organizations: No    Attends Banker Meetings: Never    Marital Status: Divorced  Catering Manager Violence: Not At Risk (11/09/2023)   Humiliation, Afraid, Rape, and Kick questionnaire    Fear of Current or Ex-Partner: No    Emotionally Abused: No    Physically Abused: No    Sexually Abused: No   Social History   Tobacco Use  Smoking Status  Never  Smokeless Tobacco Never   Social History   Substance and Sexual Activity  Alcohol Use Never    Family History:  Family History  Problem Relation Age of Onset   Arthritis Mother    COPD Mother    Early death Father    Arthritis Maternal Grandmother    Stroke Maternal Grandmother    Asthma Sister    Hyperlipidemia Sister    Asthma Daughter    Hyperlipidemia Brother     Past medical history, surgical history, medications, allergies, family history and social history reviewed with patient today and changes made to appropriate areas of the chart.   ROS All other ROS negative except what is listed above and in the HPI.      Objective:    BP 114/72   Pulse 82   Temp (!) 97.5 F (36.4 C) (Oral)   Resp 18   Ht 5' 5.3 (1.659 m)   Wt 190 lb (86.2 kg)   SpO2 95%   BMI 31.33 kg/m   Wt Readings from Last 3 Encounters:  11/09/23 190 lb (86.2 kg)  08/08/23 190 lb 3.2 oz (86.3 kg)  11/23/22 207 lb 0.2 oz (93.9 kg)    Physical Exam Vitals and nursing note reviewed. Exam conducted with a chaperone present.  Constitutional:      General: She is awake. She is not in acute distress.    Appearance: She is well-developed and well-groomed. She is obese. She is not ill-appearing or toxic-appearing.  HENT:     Head: Normocephalic and atraumatic.     Right Ear: Hearing, tympanic membrane, ear canal and external ear normal. No drainage.     Left Ear: Hearing, tympanic membrane, ear canal and external ear normal. No drainage.     Nose: Nose normal.     Right Sinus: No maxillary sinus tenderness or frontal sinus tenderness.     Left Sinus: No maxillary sinus  tenderness or frontal sinus tenderness.     Mouth/Throat:     Mouth: Mucous membranes are moist.     Pharynx: Oropharynx is clear. Uvula midline. No pharyngeal swelling, oropharyngeal exudate or posterior oropharyngeal erythema.  Eyes:     General: Lids are normal.        Right eye: No discharge.        Left eye: No  discharge.     Extraocular Movements: Extraocular movements intact.     Conjunctiva/sclera: Conjunctivae normal.     Pupils: Pupils are equal, round, and reactive to light.     Visual Fields: Right eye visual fields normal and left eye visual fields normal.  Neck:     Thyroid: No thyromegaly.     Vascular: No carotid bruit.     Trachea: Trachea normal.  Cardiovascular:     Rate and Rhythm: Normal rate and regular rhythm.     Heart sounds: Normal heart sounds. No murmur heard.    No gallop.  Pulmonary:     Effort: Pulmonary effort is normal. No accessory muscle usage or respiratory distress.     Breath sounds: Normal breath sounds.  Chest:  Breasts:    Right: Normal.     Left: Normal.  Abdominal:     General: Bowel sounds are normal.     Palpations: Abdomen is soft. There is no hepatomegaly or splenomegaly.     Tenderness: There is no abdominal tenderness.  Musculoskeletal:        General: Normal range of motion.     Cervical back: Normal range of motion and neck supple.     Right lower leg: No edema.     Left lower leg: No edema.  Lymphadenopathy:     Head:     Right side of head: No submental, submandibular, tonsillar, preauricular or posterior auricular adenopathy.     Left side of head: No submental, submandibular, tonsillar, preauricular or posterior auricular adenopathy.     Cervical: No cervical adenopathy.     Upper Body:     Right upper body: No supraclavicular, axillary or pectoral adenopathy.     Left upper body: No supraclavicular, axillary or pectoral adenopathy.  Skin:    General: Skin is warm and dry.     Capillary Refill: Capillary refill takes less than 2 seconds.     Findings: No rash.  Neurological:     Mental Status: She is alert and oriented to person, place, and time.     Gait: Gait is intact.     Deep Tendon Reflexes: Reflexes are normal and symmetric.     Reflex Scores:      Brachioradialis reflexes are 2+ on the right side and 2+ on the left  side.      Patellar reflexes are 2+ on the right side and 2+ on the left side. Psychiatric:        Attention and Perception: Attention normal.        Mood and Affect: Mood normal.        Speech: Speech normal.        Behavior: Behavior normal. Behavior is cooperative.        Thought Content: Thought content normal.        Judgment: Judgment normal.       11/09/2023    9:43 AM  6CIT Screen  What Year? 0 points  What month? 0 points  What time? 0 points  Count back from 20 0 points  Months in reverse  0 points  Repeat phrase 0 points  Total Score 0 points   Results for orders placed or performed in visit on 08/22/23  Lab report - scanned   Collection Time: 07/19/23  9:00 AM  Result Value Ref Range   EGFR 84.0       Assessment & Plan:   Problem List Items Addressed This Visit       Cardiovascular and Mediastinum   Essential hypertension   Chronic, stable with BP at goal.  Continue current medication regimen and adjust as needed.  Recent labs with integrative health scanned into system.  Recommend she monitor BP at least a few mornings a week at home and document.  DASH diet at home.  Labs today: CBC, CMP, TSH.        Relevant Orders   TSH   CBC with Differential/Platelet     Endocrine   Hypothyroidism   Diagnosed by her integrative medicine provider, they order her Armour Thyroid.  Will recheck labs today and discussed with patient she can share these with that provider via MyChart.  Continue this collaboration.      Relevant Medications   ARMOUR THYROID 15 MG tablet   Other Relevant Orders   TSH   T4, free     Other   Flu vaccine refused   Refuses Flu, PCV20, and Shingrix.  Also refuses colonoscopy or colon cancer screening.      Hyperlipidemia   Chronic.  No current medications.  Continue diet and exercise focus.  Lipid panel today. Omega labs were done with integrative medicine.      Relevant Orders   Comprehensive metabolic panel   Lipid Panel w/o  Chol/HDL Ratio   Obesity   BMI 68.66.  Recommended eating smaller high protein, low fat meals more frequently and exercising 30 mins a day 5 times a week with a goal of 10-15lb weight loss in the next 3 months. Patient voiced their understanding and motivation to adhere to these recommendations.  Recommend getting a walking pad to use at home, which allows time to exercise and can help with arthritis pain.       Prediabetes   Noted on past labs, check A1c today.  Continue diet and exercise focus at this time, start medication only as needed.      Relevant Orders   HgB A1c   Vitamin B12 deficiency   Chronic, stable.  Continue B12 injections, she receives from integrative medicine.  Could obtain here in future. Labs: B12 and CBC.      Relevant Orders   Vitamin B12   Vitamin D  deficiency   Chronic, receives injections from integrative medicine.  Continue this treatment and adjust as needed.  Labs: Vit D.      Relevant Orders   VITAMIN D  25 Hydroxy (Vit-D Deficiency, Fractures)   Other Visit Diagnoses       Medicare annual wellness visit, initial    -  Primary   Medicare wellness due and performed with patient today.     Postmenopausal estrogen deficiency       DEXA ordered today and instructed on how to schedule.   Relevant Orders   DG Bone Density     Need for hepatitis C screening test       Hep C screening obtained per guidelines today, discussed with patient.   Relevant Orders   Hepatitis C antibody        Follow up plan: Return in about 6 months (around 05/08/2024) for HTN  AND THYROID.   LABORATORY TESTING:  - Pap smear: not applicable  IMMUNIZATIONS:   - Tdap: Tetanus vaccination status reviewed: she had one February 2019. - Influenza: Refused - Pneumovax: Refused - Prevnar: Refused - COVID: Refused - HPV: Not applicable - Shingrix vaccine: Refused  SCREENING: -Mammogram: Up to date  - Colonoscopy: Refused  - Bone Density: Not applicable  -Hearing Test:  Not applicable  -Spirometry: Not applicable   PATIENT COUNSELING:   Advised to take 1 mg of folate supplement per day if capable of pregnancy.   Sexuality: Discussed sexually transmitted diseases, partner selection, use of condoms, avoidance of unintended pregnancy  and contraceptive alternatives.   Advised to avoid cigarette smoking.  I discussed with the patient that most people either abstain from alcohol or drink within safe limits (<=14/week and <=4 drinks/occasion for males, <=7/weeks and <= 3 drinks/occasion for females) and that the risk for alcohol disorders and other health effects rises proportionally with the number of drinks per week and how often a drinker exceeds daily limits.  Discussed cessation/primary prevention of drug use and availability of treatment for abuse.   Diet: Encouraged to adjust caloric intake to maintain  or achieve ideal body weight, to reduce intake of dietary saturated fat and total fat, to limit sodium intake by avoiding high sodium foods and not adding table salt, and to maintain adequate dietary potassium and calcium preferably from fresh fruits, vegetables, and low-fat dairy products.    Stressed the importance of regular exercise  Injury prevention: Discussed safety belts, safety helmets, smoke detector, smoking near bedding or upholstery.   Dental health: Discussed importance of regular tooth brushing, flossing, and dental visits.    NEXT PREVENTATIVE PHYSICAL DUE IN 1 YEAR. Return in about 6 months (around 05/08/2024) for HTN AND THYROID.

## 2023-11-09 NOTE — Assessment & Plan Note (Signed)
 BMI 31.33.  Recommended eating smaller high protein, low fat meals more frequently and exercising 30 mins a day 5 times a week with a goal of 10-15lb weight loss in the next 3 months. Patient voiced their understanding and motivation to adhere to these recommendations.  Recommend getting a walking pad to use at home, which allows time to exercise and can help with arthritis pain.

## 2023-11-10 LAB — CBC WITH DIFFERENTIAL/PLATELET
Basophils Absolute: 0 10*3/uL (ref 0.0–0.2)
Basos: 1 %
EOS (ABSOLUTE): 0.1 10*3/uL (ref 0.0–0.4)
Eos: 2 %
Hematocrit: 42.8 % (ref 34.0–46.6)
Hemoglobin: 14 g/dL (ref 11.1–15.9)
Immature Grans (Abs): 0 10*3/uL (ref 0.0–0.1)
Immature Granulocytes: 0 %
Lymphocytes Absolute: 1.2 10*3/uL (ref 0.7–3.1)
Lymphs: 25 %
MCH: 31.2 pg (ref 26.6–33.0)
MCHC: 32.7 g/dL (ref 31.5–35.7)
MCV: 95 fL (ref 79–97)
Monocytes Absolute: 0.3 10*3/uL (ref 0.1–0.9)
Monocytes: 5 %
Neutrophils Absolute: 3.3 10*3/uL (ref 1.4–7.0)
Neutrophils: 67 %
Platelets: 250 10*3/uL (ref 150–450)
RBC: 4.49 x10E6/uL (ref 3.77–5.28)
RDW: 12.5 % (ref 11.7–15.4)
WBC: 4.9 10*3/uL (ref 3.4–10.8)

## 2023-11-10 LAB — HEMOGLOBIN A1C
Est. average glucose Bld gHb Est-mCnc: 114 mg/dL
Hgb A1c MFr Bld: 5.6 % (ref 4.8–5.6)

## 2023-11-10 LAB — LIPID PANEL W/O CHOL/HDL RATIO
Cholesterol, Total: 241 mg/dL — ABNORMAL HIGH (ref 100–199)
HDL: 56 mg/dL (ref >39)
LDL Chol Calc (NIH): 161 mg/dL — ABNORMAL HIGH (ref 0–99)
Triglycerides: 134 mg/dL (ref 0–149)
VLDL Cholesterol Cal: 24 mg/dL (ref 5–40)

## 2023-11-10 LAB — COMPREHENSIVE METABOLIC PANEL
ALT: 16 [IU]/L (ref 0–32)
AST: 18 [IU]/L (ref 0–40)
Albumin: 4.2 g/dL (ref 3.9–4.9)
Alkaline Phosphatase: 112 [IU]/L (ref 44–121)
BUN/Creatinine Ratio: 13 (ref 12–28)
BUN: 9 mg/dL (ref 8–27)
Bilirubin Total: 0.3 mg/dL (ref 0.0–1.2)
CO2: 24 mmol/L (ref 20–29)
Calcium: 9 mg/dL (ref 8.7–10.3)
Chloride: 104 mmol/L (ref 96–106)
Creatinine, Ser: 0.67 mg/dL (ref 0.57–1.00)
Globulin, Total: 2.5 g/dL (ref 1.5–4.5)
Glucose: 100 mg/dL — ABNORMAL HIGH (ref 70–99)
Potassium: 3.6 mmol/L (ref 3.5–5.2)
Sodium: 141 mmol/L (ref 134–144)
Total Protein: 6.7 g/dL (ref 6.0–8.5)
eGFR: 95 mL/min/{1.73_m2} (ref >59)

## 2023-11-10 LAB — VITAMIN B12: Vitamin B-12: 1755 pg/mL — ABNORMAL HIGH (ref 232–1245)

## 2023-11-10 LAB — HEPATITIS C ANTIBODY: Hep C Virus Ab: NONREACTIVE

## 2023-11-10 LAB — VITAMIN D 25 HYDROXY (VIT D DEFICIENCY, FRACTURES): Vit D, 25-Hydroxy: 27.3 ng/mL — ABNORMAL LOW (ref 30.0–100.0)

## 2023-11-10 LAB — TSH: TSH: 0.891 u[IU]/mL (ref 0.450–4.500)

## 2023-11-10 LAB — T4, FREE: Free T4: 1.25 ng/dL (ref 0.82–1.77)

## 2023-11-10 NOTE — Progress Notes (Signed)
 Contacted via MyChart The 10-year ASCVD risk score (Arnett DK, et al., 2019) is: 9.6%   Values used to calculate the score:     Age: 68 years     Sex: Female     Is Non-Hispanic African American: No     Diabetic: No     Tobacco smoker: No     Systolic Blood Pressure: 114 mmHg     Is BP treated: Yes     HDL Cholesterol: 56 mg/dL     Total Cholesterol: 241 mg/dL   Good morning Lisa Hurley, your labs have returned: - Kidney function, creatinine and eGFR, remains normal, as is liver function, AST and ALT.  - Lipid panel is showing elevation in LDL, bad cholesterol, and total cholesterol. We will discuss this more next visit, as if we calculate your risk score for stroke or heart attack in next 10 years it is elevated above 7%, at 9.6%.  This would be a level where medication would be recommended.  Ensure heavy focus on healthy diet choices an regular exercise. - B12 level is above normal, I know you receive supplements from integrative medicine provider.  Vitamin D  is still a little low. Continue supplements with them. - Remainder of labs are stable.  Any questions? Keep being amazing!!  Thank you for allowing me to participate in your care.  I appreciate you. Kindest regards, Maricus Tanzi

## 2024-01-02 ENCOUNTER — Encounter: Payer: Self-pay | Admitting: Internal Medicine

## 2024-01-03 ENCOUNTER — Encounter
Admission: RE | Disposition: A | Discharge status: Home / Self Care | Payer: Self-pay | Source: Home / Self Care | Attending: Internal Medicine

## 2024-01-03 ENCOUNTER — Ambulatory Visit
Admission: RE | Admit: 2024-01-03 | Discharge: 2024-01-03 | Disposition: A | Discharge status: Home / Self Care | Payer: Medicare Other | Attending: Internal Medicine | Admitting: Internal Medicine

## 2024-01-03 ENCOUNTER — Ambulatory Visit: Payer: Medicare Other | Admitting: Certified Registered Nurse Anesthetist

## 2024-01-03 ENCOUNTER — Encounter: Payer: Self-pay | Admitting: Internal Medicine

## 2024-01-03 DIAGNOSIS — J45909 Unspecified asthma, uncomplicated: Secondary | ICD-10-CM | POA: Insufficient documentation

## 2024-01-03 DIAGNOSIS — R110 Nausea: Secondary | ICD-10-CM | POA: Diagnosis not present

## 2024-01-03 DIAGNOSIS — K219 Gastro-esophageal reflux disease without esophagitis: Secondary | ICD-10-CM | POA: Insufficient documentation

## 2024-01-03 DIAGNOSIS — I10 Essential (primary) hypertension: Secondary | ICD-10-CM | POA: Insufficient documentation

## 2024-01-03 DIAGNOSIS — K222 Esophageal obstruction: Secondary | ICD-10-CM | POA: Insufficient documentation

## 2024-01-03 DIAGNOSIS — R6881 Early satiety: Secondary | ICD-10-CM | POA: Insufficient documentation

## 2024-01-03 DIAGNOSIS — G4730 Sleep apnea, unspecified: Secondary | ICD-10-CM | POA: Insufficient documentation

## 2024-01-03 DIAGNOSIS — E039 Hypothyroidism, unspecified: Secondary | ICD-10-CM | POA: Insufficient documentation

## 2024-01-03 DIAGNOSIS — K2970 Gastritis, unspecified, without bleeding: Secondary | ICD-10-CM | POA: Insufficient documentation

## 2024-01-03 DIAGNOSIS — R1013 Epigastric pain: Secondary | ICD-10-CM | POA: Diagnosis present

## 2024-01-03 HISTORY — PX: ESOPHAGOGASTRODUODENOSCOPY (EGD) WITH PROPOFOL: SHX5813

## 2024-01-03 HISTORY — PX: BIOPSY: SHX5522

## 2024-01-03 SURGERY — ESOPHAGOGASTRODUODENOSCOPY (EGD) WITH PROPOFOL
Anesthesia: General

## 2024-01-03 MED ORDER — PROPOFOL 10 MG/ML IV BOLUS
INTRAVENOUS | Status: DC | PRN
  Administered 2024-01-03: 100 mg via INTRAVENOUS
  Administered 2024-01-03: 125 ug/kg/min via INTRAVENOUS

## 2024-01-03 MED ORDER — SODIUM CHLORIDE 0.9 % IV SOLN
INTRAVENOUS | Status: DC
  Administered 2024-01-03: 20 mL/h via INTRAVENOUS

## 2024-01-03 MED ORDER — LIDOCAINE HCL (CARDIAC) PF 100 MG/5ML IV SOSY
PREFILLED_SYRINGE | INTRAVENOUS | Status: DC | PRN
  Administered 2024-01-03: 50 mg via INTRAVENOUS

## 2024-01-03 NOTE — Anesthesia Procedure Notes (Signed)
 Date/Time: 01/03/2024 10:33 AM  Performed by: Ginger Carne, CRNAPre-anesthesia Checklist: Patient identified, Emergency Drugs available, Suction available, Patient being monitored and Timeout performed Patient Re-evaluated:Patient Re-evaluated prior to induction Oxygen Delivery Method: Nasal cannula Preoxygenation: Pre-oxygenation with 100% oxygen Induction Type: IV induction

## 2024-01-03 NOTE — Transfer of Care (Signed)
 Immediate Anesthesia Transfer of Care Note  Patient: Lisa Hurley  Procedure(s) Performed: ESOPHAGOGASTRODUODENOSCOPY (EGD) WITH PROPOFOL BIOPSY  Patient Location: Endoscopy Unit  Anesthesia Type:General  Level of Consciousness: awake, alert , and oriented  Airway & Oxygen Therapy: Patient Spontanous Breathing  Post-op Assessment: Report given to RN and Post -op Vital signs reviewed and stable  Post vital signs: Reviewed and stable  Last Vitals:  Vitals Value Taken Time  BP 103/61 01/03/24 1049  Temp    Pulse 81 01/03/24 1049  Resp 22 01/03/24 1049  SpO2 96 % 01/03/24 1049  Vitals shown include unfiled device data.  Last Pain:  Vitals:   01/03/24 1049  TempSrc:   PainSc: 0-No pain         Complications: No notable events documented.

## 2024-01-03 NOTE — Interval H&P Note (Signed)
 History and Physical Interval Note:  01/03/2024 10:27 AM  Lisa Hurley  has presented today for surgery, with the diagnosis of R10.13 (ICD-10-CM) - Epigastric pain R14.0 (ICD-10-CM) - Bloating R68.81 (ICD-10-CM) - Early satiety.  The various methods of treatment have been discussed with the patient and family. After consideration of risks, benefits and other options for treatment, the patient has consented to  Procedure(s): ESOPHAGOGASTRODUODENOSCOPY (EGD) WITH PROPOFOL (N/A) as a surgical intervention.  The patient's history has been reviewed, patient examined, no change in status, stable for surgery.  I have reviewed the patient's chart and labs.  Questions were answered to the patient's satisfaction.     [place], [place]

## 2024-01-03 NOTE — Anesthesia Preprocedure Evaluation (Signed)
 Anesthesia Evaluation  Patient identified by MRN, date of birth, ID band Patient awake    Reviewed: Allergy & Precautions, NPO status , Patient's Chart, lab work & pertinent test results  Airway Mallampati: III  TM Distance: <3 FB Neck ROM: full    Dental  (+) Chipped   Pulmonary asthma , sleep apnea    Pulmonary exam normal        Cardiovascular hypertension, negative cardio ROS Normal cardiovascular exam     Neuro/Psych  PSYCHIATRIC DISORDERS       Neuromuscular disease    GI/Hepatic Neg liver ROS, hiatal hernia,GERD  Controlled,,  Endo/Other  Hypothyroidism    Renal/GU negative Renal ROS  negative genitourinary   Musculoskeletal   Abdominal   Peds  Hematology negative hematology ROS (+)   Anesthesia Other Findings Past Medical History: 1970s: Allergy No date: Arthritis No date: Asthma No date: Depression No date: GERD (gastroesophageal reflux disease) No date: Hypertension 11/09/2023: Hypothyroidism No date: Lyme disease No date: Sleep apnea  Past Surgical History: No date: ABDOMINAL HYSTERECTOMY  BMI    Body Mass Index: 29.91 kg/m      Reproductive/Obstetrics negative OB ROS                             Anesthesia Physical Anesthesia Plan  ASA: 3  Anesthesia Plan: General   Post-op Pain Management:    Induction: Intravenous  PONV Risk Score and Plan: Propofol infusion and TIVA  Airway Management Planned: Natural Airway and Nasal Cannula  Additional Equipment:   Intra-op Plan:   Post-operative Plan:   Informed Consent: I have reviewed the patients History and Physical, chart, labs and discussed the procedure including the risks, benefits and alternatives for the proposed anesthesia with the patient or authorized representative who has indicated his/her understanding and acceptance.     Dental Advisory Given  Plan Discussed with: Anesthesiologist, CRNA and  Surgeon  Anesthesia Plan Comments: (Patient consented for risks of anesthesia including but not limited to:  - adverse reactions to medications - risk of airway placement if required - damage to eyes, teeth, lips or other oral mucosa - nerve damage due to positioning  - sore throat or hoarseness - Damage to heart, brain, nerves, lungs, other parts of body or loss of life  Patient voiced understanding and assent.)       Anesthesia Quick Evaluation

## 2024-01-03 NOTE — Anesthesia Postprocedure Evaluation (Signed)
 Anesthesia Post Note  Patient: Lisa Hurley  Procedure(s) Performed: ESOPHAGOGASTRODUODENOSCOPY (EGD) WITH PROPOFOL BIOPSY  Patient location during evaluation: Endoscopy Anesthesia Type: General Level of consciousness: awake and alert Pain management: pain level controlled Vital Signs Assessment: post-procedure vital signs reviewed and stable Respiratory status: spontaneous breathing, nonlabored ventilation, respiratory function stable and patient connected to nasal cannula oxygen Cardiovascular status: blood pressure returned to baseline and stable Postop Assessment: no apparent nausea or vomiting Anesthetic complications: no   No notable events documented.   Last Vitals:  Vitals:   01/03/24 1057 01/03/24 1059  BP: 99/60 99/60  Pulse: 73 75  Resp: (!) 22 (!) 24  Temp:    SpO2: 98% 98%    Last Pain:  Vitals:   01/03/24 1057  TempSrc:   PainSc: 0-No pain                 Cleda Mccreedy Gertrude Bucks

## 2024-01-03 NOTE — Op Note (Signed)
 [facility] Gastroenterology Patient Name: Lisa Hurley Procedure Date: 01/03/2024 10:23 AM MRN: [id number] Account #: [id number] Date of Birth: [birth year] Admit Type: Outpatient Age: 68 Room: [facility] ENDO ROOM 2 Gender: Female Note Status: Finalized Instrument Name: Patton Salles Endoscope [id number] Procedure:             Upper GI endoscopy Indications:           Epigastric abdominal pain, Early satiety, Nausea Providers:             Boykin Nearing. Norma Fredrickson MD, MD Referring MD:          Dorie Rank. Cannady (Referring MD) Medicines:             Propofol per Anesthesia Complications:         No immediate complications. Estimated blood loss: None. Procedure:             Pre-Anesthesia Assessment:                        - The risks and benefits of the procedure and the                         sedation options and risks were discussed with the                         patient. All questions were answered and informed                         consent was obtained.                        - Patient identification and proposed procedure were                         verified prior to the procedure by the nurse. The                         procedure was verified in the procedure room.                        - ASA Grade Assessment: III - A patient with severe                         systemic disease.                        - After reviewing the risks and benefits, the patient                         was deemed in satisfactory condition to undergo the                         procedure.                        After obtaining informed consent, the endoscope was                         passed under direct vision. Throughout the procedure,  the patient's blood pressure, pulse, and oxygen                         saturations were monitored continuously. The Endoscope                         was introduced through the mouth, and advanced to the                         second  part of duodenum. The upper GI endoscopy was                         accomplished without difficulty. The patient tolerated                         the procedure well. Findings:      A non-obstructing Schatzki ring was found in the lower third of the       esophagus.      The exam of the esophagus was otherwise normal.      Scattered minimal inflammation characterized by erythema was found in       the gastric antrum. Biopsies were taken with a cold forceps for       Helicobacter pylori testing.      The exam of the stomach was otherwise normal.      The cardia and gastric fundus were normal on retroflexion.      The examined duodenum was normal. Impression:            - Non-obstructing Schatzki ring.                        - Gastritis. Biopsied.                        - Normal examined duodenum. Recommendation:        - Patient has a contact number available for                         emergencies. The signs and symptoms of potential                         delayed complications were discussed with the patient.                         Return to normal activities tomorrow. Written                         discharge instructions were provided to the patient.                        - Resume previous diet.                        - Continue present medications.                        - Await pathology results.                        - Follow up with Tawni Pummel, PA-C at [facility]. [phone].                        - Telephone GI office to schedule appointment in 2                         months.                        - The findings and recommendations were discussed with                         the patient. Procedure Code(s):     --- Professional ---                        [phone], Esophagogastroduodenoscopy, flexible,                         transoral; with biopsy, single or multiple Diagnosis Code(s):     --- Professional ---                         R11.0, Nausea                        R68.81, Early satiety                        R10.13, Epigastric pain                        K29.70, Gastritis, unspecified, without bleeding                        K22.2, Esophageal obstruction CPT copyright 2022 American Medical Association. All rights reserved. The codes documented in this report are preliminary and upon coder review may  be revised to meet current compliance requirements. Stanton Kidney MD, MD 01/03/2024 10:47:17 AM This report has been signed electronically. Number of Addenda: 0 Note Initiated On: 01/03/2024 10:23 AM Estimated Blood Loss:  Estimated blood loss: none.      [facility]

## 2024-01-03 NOTE — H&P (Signed)
 Outpatient short stay form Pre-procedure 01/03/2024 10:25 AM Lisa Hurley Lisa Hurley, M.D.  Primary Physician: Lisa Dials, NP  Reason for visit:  Epigastric pain, nausea, early satiety  History of present illness:  Lisa Hurley reports over the past several years having issues with epigastric pain, this initially began after she had COVID in 2022. She endorses having frequent epigastric pain and endorses bloating and pressure in epigastric area. Feels it is fairly constant epigastric pain but does worsen after eating. She feels that a lot of times has early satiety as well as nausea due to the pain. She denies frequent vomiting. She denies a lot of heartburn indigestion. She takes ibuprofen 3-4 times a week. She has never taken a PPI. She denies any dysphagia. Reports her PCP checked her for H. pylori was negative.  She reports despite her poor appetite/early satiety and she has not lost any weight. Tries to eat small meals.  Tends to have fairly regular bowel movements, takes a "Calm for kids" supplement with Magnesium in it. She feels if she takes the adult Calm supplemental causes diarrhea due to too much magnesium but Kids supplement helps her bowel regularity. Also notes diarrhea with vitamin C supplement. No rectal bleeding or lower abd pain.      Current Facility-Administered Medications:    0.9 %  sodium chloride infusion, , Intravenous, Continuous, Lisa Hurley, Lisa Nearing, MD, Last Rate: 20 mL/hr at 01/03/24 1016, 20 mL/hr at 01/03/24 1016  Medications Prior to Admission  Medication Sig Dispense Refill Last Dose/Taking   amLODipine (NORVASC) 10 MG tablet Take 10 mg by mouth daily.   01/02/2024   ARMOUR THYROID 15 MG tablet Take 15 mg by mouth daily.   Past Week   cyanocobalamin (VITAMIN B12) 100 MCG tablet Take 100 mcg by mouth every other day.   Past Week   loratadine (CLARITIN) 10 MG tablet Take 10 mg by mouth daily as needed for allergies.   Past Week   Naltrexone HCl, Pain, 4.5 MG CAPS  Take 1 capsule by mouth daily.   01/02/2024   Omega-3 Fatty Acids (OMEGA-3 PO) Take 200 mg by mouth daily at 2 PM.   Past Week   UNABLE TO FIND Take 1 tablet by mouth every other day. Vitamin K2, Vitamin K1, Magnesium, and Zinc   Past Week     Allergies  Allergen Reactions   Budesonide-Formoterol Fumarate Swelling   Amoxicillin    Estrogens    Levofloxacin    Lisinopril     Cannot remember   Macrolides And Ketolides    Metronidazole Nausea Only   Penicillins    Sulfa Antibiotics      Past Medical History:  Diagnosis Date   Allergy 1970s   Arthritis    Asthma    Depression    GERD (gastroesophageal reflux disease)    Hypertension    Hypothyroidism 11/09/2023   Lyme disease    Sleep apnea     Review of systems:  Otherwise negative.    Physical Exam  Gen: Alert, oriented. Appears stated age.  HEENT: Levasy/AT. PERRLA. Lungs: CTA, no wheezes. CV: RR nl S1, S2. Abd: soft, benign, no masses. BS+ Ext: No edema. Pulses 2+    Planned procedures: Proceed with Esophagogastroduodenoscopy. The patient understands the nature of the planned procedure, indications, risks, alternatives and potential complications including but not limited to bleeding, infection, perforation, damage to internal organs and possible oversedation/side effects from anesthesia. The patient agrees and gives consent to proceed.  Please refer to  procedure notes for findings, recommendations and patient disposition/instructions.     Lisa Hurley Lisa Hurley, M.D. Gastroenterology 01/03/2024  10:25 AM

## 2024-01-04 LAB — SURGICAL PATHOLOGY

## 2024-02-20 ENCOUNTER — Other Ambulatory Visit: Payer: Self-pay | Admitting: Nurse Practitioner

## 2024-02-20 DIAGNOSIS — R921 Mammographic calcification found on diagnostic imaging of breast: Secondary | ICD-10-CM

## 2024-04-02 ENCOUNTER — Ambulatory Visit
Admission: RE | Admit: 2024-04-02 | Discharge: 2024-04-02 | Disposition: A | Discharge status: Home / Self Care | Source: Ambulatory Visit | Attending: Nurse Practitioner | Admitting: Nurse Practitioner

## 2024-04-02 DIAGNOSIS — R921 Mammographic calcification found on diagnostic imaging of breast: Secondary | ICD-10-CM | POA: Diagnosis present

## 2024-04-02 DIAGNOSIS — Z780 Asymptomatic menopausal state: Secondary | ICD-10-CM | POA: Insufficient documentation

## 2024-04-03 ENCOUNTER — Ambulatory Visit: Payer: Self-pay | Admitting: Nurse Practitioner

## 2024-04-03 NOTE — Progress Notes (Signed)
 Contacted via MyChart   Good afternoon Wyonia, your bone density has returned and is noting osteoporosis -- or fragile bone.  Are you still seeing integrative medicine provider?  You could discuss options with them and then at visit in July I can educate you on prescription we use to help improve bone density.  Ensure good Vitamin D  and calcium intake daily.  Any questions? Keep being amazing!!  Thank you for allowing me to participate in your care.  I appreciate you. Kindest regards, Naria Abbey

## 2024-05-11 ENCOUNTER — Encounter: Payer: Self-pay | Admitting: Nurse Practitioner

## 2024-05-11 DIAGNOSIS — M810 Age-related osteoporosis without current pathological fracture: Secondary | ICD-10-CM | POA: Insufficient documentation

## 2024-05-11 NOTE — Patient Instructions (Signed)
Eating Plan for Osteoporosis Osteoporosis causes your bones to become weak and brittle. This puts you at greater risk for bone breaks (fractures) from small bumps or falls. Making changes to your diet and increasing your physical activity can help strengthen your bones and improve your overall health. Calcium and vitamin D are nutrients that play an important role in bone health. Vitamin D helps your body use calcium and strengthen bones. It is important to get enough calcium and vitamin D as part of your eating plan for osteoporosis. What are tips for following this plan? Reading food labels Try to get at least 1,000 milligrams (mg) of calcium each day. Look for foods that have at least 50 mg of calcium per serving. Talk with your health care provider about taking a calcium supplement if you do not get enough calcium from food. Do not have more than 2,500 mg of calcium each day. This is the upper limit for food and nutritional supplements combined. Too much calcium may cause constipation and prevent you from absorbing other important nutrients. Choose foods that contain vitamin D. Take a daily vitamin supplement that contains 800-1,000 international units (IU) of vitamin D. The amount may be different depending on your age, body weight, and where you live. Talk with your dietitian or health care provider about how much vitamin D is right for you. Avoid foods that have more than 300 mg of sodium per serving. Too much sodium can cause your body to lose calcium. Talk with your dietitian or health care provider about how much sodium you are allowed each day. Shopping Do not buy foods with added salt, including: Salted snacks. Rosita Fire. Canned soups. Canned meats. Processed meats, such as bacon or precooked or cured meat like sausages or meat loaves. Smoked fish. Meal planning Eat balanced meals that contain protein foods, fruits and vegetables, and foods rich in calcium and vitamin D. Eat at least  5 servings of fruits and vegetables each day. Eat 5-6 oz (142-170 g) of lean meat, poultry, fish, eggs, or beans each day. Lifestyle Do not use any products that contain nicotine or tobacco, such as cigarettes, e-cigarettes, and chewing tobacco. If you need help quitting, ask your health care provider. If your health care provider recommends that you lose weight: Work with a dietitian to develop an eating plan that will help you reach your desired weight goal. Exercise for at least 30 minutes a day, 5 or more days a week, or as told by your health care provider. Work with a physical therapist to develop an exercise plan that includes flexibility, balance, and strength exercises. Do not focus only on aerobic exercise. Do not drink alcohol if: Your health care provider tells you not to drink. You are pregnant, may be pregnant, or are planning to become pregnant. If you drink alcohol: Limit how much you use to: 0-1 drink a day for women. 0-2 drinks a day for men. Be aware of how much alcohol is in your drink. In the [place] one drink equals one 12 oz bottle of beer (355 mL), one 5 oz glass of wine (148 mL), or one 1 oz glass of hard liquor (44 mL). What foods should I eat? Foods high in calcium  Yogurt. Yogurt with fruit. Milk. Evaporated skim milk. Dry milk powder. Calcium-fortified orange juice. Parmesan cheese. Part-skim ricotta cheese. Natural hard cheese. Cream cheese. Cottage cheese. Canned sardines. Canned salmon. Calcium-treated tofu. Calcium-fortified cereal bar. Calcium-fortified cereal. Calcium-fortified graham crackers. Cooked collard greens. Turnip greens. Broccoli.  Kale. Almonds. White beans. Corn tortilla. Foods high in vitamin D Cod liver oil. Fatty fish, such as tuna, mackerel, and salmon. Milk. Fortified soy milk. Fortified fruit juice. Yogurt. Margarine. Egg yolks. Foods high in protein Beef. Lamb. Pork tenderloin. Chicken breast. Tuna (canned). Fish  fillet. Tofu. Cooked soy beans. Soy patty. Beans (canned or cooked). Cottage cheese. Yogurt. Peanut butter. Pumpkin seeds. Nuts. Sunflower seeds. Hard cheese. Milk or other milk products, such as soy milk. The items listed above may not be a complete list of foods and beverages you can eat. Contact a dietitian for more options. Summary Calcium and vitamin D are nutrients that play an important role in bone health and are an important part of your eating plan for osteoporosis. Eat balanced meals that contain protein foods, fruits and vegetables, and foods rich in calcium and vitamin D. Avoid foods that have more than 300 mg of sodium per serving. Too much sodium can cause your body to lose calcium. Exercise is an important part of prevention and treatment of osteoporosis. Aim for at least 30 minutes a day, 5 days a week. This information is not intended to replace advice given to you by your health care provider. Make sure you discuss any questions you have with your health care provider. Document Revised: 04/09/2020 Document Reviewed: 04/09/2020 Elsevier Patient Education  2024 [employer].

## 2024-05-17 ENCOUNTER — Ambulatory Visit (INDEPENDENT_AMBULATORY_CARE_PROVIDER_SITE_OTHER): Payer: Self-pay | Admitting: Nurse Practitioner

## 2024-05-17 ENCOUNTER — Encounter: Payer: Self-pay | Admitting: Nurse Practitioner

## 2024-05-17 VITALS — BP 126/84 | HR 89 | Temp 97.7°F | Ht 65.3 in | Wt 187.2 lb

## 2024-05-17 DIAGNOSIS — E66811 Obesity, class 1: Secondary | ICD-10-CM

## 2024-05-17 DIAGNOSIS — G4733 Obstructive sleep apnea (adult) (pediatric): Secondary | ICD-10-CM

## 2024-05-17 DIAGNOSIS — I10 Essential (primary) hypertension: Secondary | ICD-10-CM

## 2024-05-17 DIAGNOSIS — E039 Hypothyroidism, unspecified: Secondary | ICD-10-CM

## 2024-05-17 DIAGNOSIS — E6609 Other obesity due to excess calories: Secondary | ICD-10-CM

## 2024-05-17 DIAGNOSIS — E782 Mixed hyperlipidemia: Secondary | ICD-10-CM

## 2024-05-17 DIAGNOSIS — Z6830 Body mass index (BMI) 30.0-30.9, adult: Secondary | ICD-10-CM

## 2024-05-17 DIAGNOSIS — M810 Age-related osteoporosis without current pathological fracture: Secondary | ICD-10-CM | POA: Diagnosis not present

## 2024-05-17 NOTE — Assessment & Plan Note (Signed)
 Diagnosed by her integrative medicine provider, they order her Armour Thyroid.  Labs today: up to date with functional medicine.

## 2024-05-17 NOTE — Progress Notes (Signed)
 BP 126/84   Pulse 89   Temp 97.7 F (36.5 C) (Oral)   Ht 5' 5.3 (1.659 m)   Wt 187 lb 3.2 oz (84.9 kg)   SpO2 97%   BMI 30.87 kg/m    Subjective:    Patient ID: Lisa Hurley, female    DOB: 1956/05/28, 68 y.o.   MRN: [id number]  HPI: Lisa Hurley is a 68 y.o. female  Chief Complaint  Patient presents with   Hypertension   Hypothyroidism   HYPERTENSION without Chronic Kidney Disease Continues to take Amlodipine .  Follows with functional provider, sees when able. CRT 0.68 and eGFR 95 and has been using walking pad. Recent A1c 5.5% one week ago with functional medicine provider. Is retired now. Hypertension status: stable  Satisfied with current treatment? yes Duration of hypertension: chronic BP monitoring frequency:  not checking BP range:  BP medication side effects:  no Medication compliance: good compliance Aspirin: no Recurrent headaches: no Visual changes: no Palpitations: no Dyspnea: no Chest pain: no Lower extremity edema: no Dizzy/lightheaded: no  The 10-year ASCVD risk score (Arnett DK, et al., 2019) is: 11.7%   Values used to calculate the score:     Age: 37 years     Clincally relevant sex: Female     Is Non-Hispanic African American: No     Diabetic: No     Tobacco smoker: No     Systolic Blood Pressure: 126 mmHg     Is BP treated: Yes     HDL Cholesterol: 56 mg/dL     Total Cholesterol: 241 mg/dL  HYPOTHYROIDISM Recent TSH 1.360 and Free T4 1.24 with functional medicine last week. Taking NP Thyroid 30 MG tablet daily. Thyroid control status:stable Satisfied with current treatment? yes Medication side effects: no Medication compliance: good compliance Etiology of hypothyroidism: unknown Recent dose adjustment:yes functional medicine increased to 30 Fatigue: occasional Cold intolerance: no Heat intolerance: no Weight gain: no Weight loss: no Constipation: yes Diarrhea/loose stools: no Palpitations: no Lower extremity edema:  no Anxiety/depressed mood: no   OSTEOPOROSIS Taking Vitamin D  injections from functional medicine provider. No recent falls or fractures. Satisfied with current treatment?: yes Past osteoporosis medications/treatments: none Adequate calcium & vitamin D : yes Weight bearing exercises: yes      05/17/2024    9:21 AM 11/09/2023    9:44 AM 08/08/2023   10:34 AM 08/08/2023    9:49 AM  Depression screen PHQ 2/9  Decreased Interest 1 0 0 0  Down, Depressed, Hopeless 0 0 0 0  PHQ - 2 Score 1 0 0 0  Altered sleeping 0 1 0 2  Tired, decreased energy 3 2 3 3   Change in appetite 3 2 3 3   Feeling bad or failure about yourself  0 0 0 0  Trouble concentrating 1 2 0 2  Moving slowly or fidgety/restless 0 0 0 0  Suicidal thoughts 0 0 0 0  PHQ-9 Score 8 7 6 10   Difficult doing work/chores Somewhat difficult Not difficult at all Not difficult at all Not difficult at all       05/17/2024    9:21 AM 11/09/2023    9:46 AM 08/08/2023    9:49 AM  GAD 7 : Generalized Anxiety Score  Nervous, Anxious, on Edge 0 0 0  Control/stop worrying 1 0 0  Worry too much - different things 0 0 0  Trouble relaxing 0 0 0  Restless 0 0 0  Easily annoyed or irritable 0 1 0  Afraid - awful might happen 1 0 0  Total GAD 7 Score 2 1 0  Anxiety Difficulty Not difficult at all Not difficult at all Not difficult at all   Relevant past medical, surgical, family and social history reviewed and updated as indicated. Interim medical history since our last visit reviewed. Allergies and medications reviewed and updated.  Review of Systems  Constitutional:  Negative for activity change, appetite change, diaphoresis, fatigue and fever.  Respiratory:  Negative for cough, chest tightness, shortness of breath and wheezing.   Cardiovascular:  Negative for chest pain, palpitations and leg swelling.  Gastrointestinal:  Positive for constipation. Negative for diarrhea.  Endocrine: Positive for heat intolerance. Negative for cold  intolerance.  Neurological: Negative.   Psychiatric/Behavioral: Negative.     Per HPI unless specifically indicated above     Objective:    BP 126/84   Pulse 89   Temp 97.7 F (36.5 C) (Oral)   Ht 5' 5.3 (1.659 m)   Wt 187 lb 3.2 oz (84.9 kg)   SpO2 97%   BMI 30.87 kg/m   Wt Readings from Last 3 Encounters:  05/17/24 187 lb 3.2 oz (84.9 kg)  01/03/24 191 lb (86.6 kg)  11/09/23 190 lb (86.2 kg)    Physical Exam Vitals and nursing note reviewed.  Constitutional:      General: She is awake. She is not in acute distress.    Appearance: She is well-developed and well-groomed. She is not ill-appearing or toxic-appearing.  HENT:     Head: Normocephalic.     Right Ear: Hearing and external ear normal.     Left Ear: Hearing and external ear normal.  Eyes:     General: Lids are normal.        Right eye: No discharge.        Left eye: No discharge.     Conjunctiva/sclera: Conjunctivae normal.     Pupils: Pupils are equal, round, and reactive to light.  Neck:     Thyroid: No thyromegaly.     Vascular: No carotid bruit.  Cardiovascular:     Rate and Rhythm: Normal rate and regular rhythm.     Heart sounds: Normal heart sounds. No murmur heard.    No gallop.  Pulmonary:     Effort: Pulmonary effort is normal. No accessory muscle usage or respiratory distress.     Breath sounds: Normal breath sounds.  Abdominal:     General: Bowel sounds are normal. There is no distension.     Palpations: Abdomen is soft.     Tenderness: There is no abdominal tenderness.  Musculoskeletal:     Cervical back: Normal range of motion and neck supple.     Right lower leg: No edema.     Left lower leg: No edema.  Lymphadenopathy:     Cervical: No cervical adenopathy.  Skin:    General: Skin is warm and dry.  Neurological:     Mental Status: She is alert and oriented to person, place, and time.     Deep Tendon Reflexes: Reflexes are normal and symmetric.     Reflex Scores:       Brachioradialis reflexes are 2+ on the right side and 2+ on the left side.      Patellar reflexes are 2+ on the right side and 2+ on the left side. Psychiatric:        Attention and Perception: Attention normal.        Mood and Affect: Mood normal.  Speech: Speech normal.        Behavior: Behavior normal. Behavior is cooperative.        Thought Content: Thought content normal.    Results for orders placed or performed during the hospital encounter of 01/03/24  Surgical pathology   Collection Time: 01/03/24 12:00 AM  Result Value Ref Range   SURGICAL PATHOLOGY      SURGICAL PATHOLOGY [facility] [address] [place], [place] [address] Telephone [phone] or [phone] Fax [phone]  REPORT OF SURGICAL PATHOLOGY   Accession #: [id number] Patient Name: MONITA, SWIER Visit # : [id number]  MRN: [id number] Physician: Aundria Lombard DOB/Age September 25, 1956 (Age: 14) Gender: F Collected Date: 01/03/2024 Received Date: 01/03/2024  FINAL DIAGNOSIS       1. Stomach, biopsy, Random; cbx :       - ANTRAL MUCOSA WITH MILD REACTIVE GASTRITIS.      - UNREMARKABLE OXYNTIC MUCOSA.      - NEGATIVE FOR H. PYLORI, INTESTINAL METAPLASIA, DYSPLASIA, AND MALIGNANCY.       DATE SIGNED OUT: 01/04/2024 ELECTRONIC SIGNATURE : Janel Md, Rexene , Pathologist, Electronic Signature  MICROSCOPIC DESCRIPTION  CASE COMMENTS STAINS USED IN DIAGNOSIS: H&E    CLINICAL HISTORY  SPECIMEN(S) OBTAINED 1. Stomach, biopsy, Random; Cbx  SPECIMEN COMMENTS: 1. R/o H. pylori SPECIMEN CLINICAL INFORMATION:  1. Epigastric pain, Bloating, early satiety. Mild gastritis, schatzki ring.    Gross Description 1. CBX random stomach R/O H. pylori, received in formalin are 2 tan-pink tissue fragments that are 0.5 x 0.2 x 0.1 cm each. The specimen is submitted in toto in 1 block (1A).      [facility] 01/03/2024        Report signed out from the following  location(s) . [facility] [address] [place], [place] [address] CLIA #: [id number]  [facility] [address] [place], [place] [address] CLIA #: [id number]       Assessment & Plan:   Problem List Items Addressed This Visit       Cardiovascular and Mediastinum   Essential hypertension - Primary   Chronic, stable with BP at goal.  Continue current medication regimen and adjust as needed.  Recent labs with integrative health scanned into system.  Recommend she monitor BP at least a few mornings a week at home and document.  DASH diet at home.  Labs today: up to date with functional medicine.        Endocrine   Hypothyroidism   Diagnosed by her integrative medicine provider, they order her Armour Thyroid.  Labs today: up to date with functional medicine.      Relevant Medications   thyroid (NP THYROID) 30 MG tablet     Musculoskeletal and Integument   Osteoporosis   Noted on imaging 04/02/24. She prefers to focus on more natural treatments, continue Vitamin D  shots at home and repeat DEXA in 2 years.      Relevant Medications   VITAMIN E PO     Other   Obesity   BMI 30.87.  Recommended eating smaller high protein, low fat meals more frequently and exercising 30 mins a day 5 times a week with a goal of 10-15lb weight loss in the next 3 months. Patient voiced their understanding and motivation to adhere to these recommendations.  Recommend getting a walking pad to use at home, which allows time to exercise and can help with arthritis pain.  Hyperlipidemia   Chronic.  No current medications.  Continue diet and exercise focus.  Labs today: up to date with functional medicine.        Follow up plan: Return in about 6 months (around 11/17/2024) for HTN/HLD, Hypothyroid.

## 2024-05-17 NOTE — Assessment & Plan Note (Signed)
 Noted on imaging 04/02/24. She prefers to focus on more natural treatments, continue Vitamin D  shots at home and repeat DEXA in 2 years.

## 2024-05-17 NOTE — Assessment & Plan Note (Signed)
 BMI 30.87.  Recommended eating smaller high protein, low fat meals more frequently and exercising 30 mins a day 5 times a week with a goal of 10-15lb weight loss in the next 3 months. Patient voiced their understanding and motivation to adhere to these recommendations.  Recommend getting a walking pad to use at home, which allows time to exercise and can help with arthritis pain.

## 2024-05-17 NOTE — Assessment & Plan Note (Signed)
 Chronic.  No current medications.  Continue diet and exercise focus.  Labs today: up to date with functional medicine.

## 2024-05-17 NOTE — Assessment & Plan Note (Signed)
 Chronic, stable with BP at goal.  Continue current medication regimen and adjust as needed.  Recent labs with integrative health scanned into system.  Recommend she monitor BP at least a few mornings a week at home and document.  DASH diet at home.  Labs today: up to date with functional medicine.

## 2024-09-17 ENCOUNTER — Other Ambulatory Visit: Payer: Self-pay | Admitting: Nurse Practitioner

## 2024-09-17 DIAGNOSIS — Z1231 Encounter for screening mammogram for malignant neoplasm of breast: Secondary | ICD-10-CM

## 2024-09-17 DIAGNOSIS — R928 Other abnormal and inconclusive findings on diagnostic imaging of breast: Secondary | ICD-10-CM

## 2024-10-10 ENCOUNTER — Ambulatory Visit

## 2024-10-10 ENCOUNTER — Ambulatory Visit
Admission: RE | Admit: 2024-10-10 | Discharge: 2024-10-10 | Disposition: A | Source: Ambulatory Visit | Attending: Nurse Practitioner | Admitting: Nurse Practitioner

## 2024-10-10 DIAGNOSIS — Z1231 Encounter for screening mammogram for malignant neoplasm of breast: Secondary | ICD-10-CM | POA: Diagnosis present

## 2024-10-10 DIAGNOSIS — R928 Other abnormal and inconclusive findings on diagnostic imaging of breast: Secondary | ICD-10-CM | POA: Diagnosis present

## 2024-11-16 NOTE — Patient Instructions (Signed)

## 2024-11-19 ENCOUNTER — Ambulatory Visit (INDEPENDENT_AMBULATORY_CARE_PROVIDER_SITE_OTHER): Admitting: Nurse Practitioner

## 2024-11-19 ENCOUNTER — Encounter: Payer: Self-pay | Admitting: Nurse Practitioner

## 2024-11-19 VITALS — BP 122/82 | HR 68 | Temp 97.6°F | Resp 16 | Ht 65.32 in | Wt 187.0 lb

## 2024-11-19 DIAGNOSIS — R220 Localized swelling, mass and lump, head: Secondary | ICD-10-CM | POA: Insufficient documentation

## 2024-11-19 DIAGNOSIS — M545 Low back pain, unspecified: Secondary | ICD-10-CM

## 2024-11-19 DIAGNOSIS — M810 Age-related osteoporosis without current pathological fracture: Secondary | ICD-10-CM

## 2024-11-19 DIAGNOSIS — R350 Frequency of micturition: Secondary | ICD-10-CM | POA: Insufficient documentation

## 2024-11-19 DIAGNOSIS — I10 Essential (primary) hypertension: Secondary | ICD-10-CM

## 2024-11-19 DIAGNOSIS — G8929 Other chronic pain: Secondary | ICD-10-CM

## 2024-11-19 DIAGNOSIS — E66811 Obesity, class 1: Secondary | ICD-10-CM

## 2024-11-19 DIAGNOSIS — E6609 Other obesity due to excess calories: Secondary | ICD-10-CM | POA: Diagnosis not present

## 2024-11-19 DIAGNOSIS — E039 Hypothyroidism, unspecified: Secondary | ICD-10-CM

## 2024-11-19 DIAGNOSIS — R7303 Prediabetes: Secondary | ICD-10-CM | POA: Diagnosis not present

## 2024-11-19 DIAGNOSIS — Z6830 Body mass index (BMI) 30.0-30.9, adult: Secondary | ICD-10-CM

## 2024-11-19 DIAGNOSIS — M546 Pain in thoracic spine: Secondary | ICD-10-CM | POA: Diagnosis not present

## 2024-11-19 DIAGNOSIS — E782 Mixed hyperlipidemia: Secondary | ICD-10-CM | POA: Diagnosis not present

## 2024-11-19 DIAGNOSIS — L821 Other seborrheic keratosis: Secondary | ICD-10-CM | POA: Insufficient documentation

## 2024-11-19 LAB — URINALYSIS, ROUTINE W REFLEX MICROSCOPIC
Glucose, UA: NEGATIVE
Nitrite, UA: NEGATIVE
Specific Gravity, UA: 1.03 (ref 1.005–1.030)
Urobilinogen, Ur: 0.2 mg/dL (ref 0.2–1.0)
pH, UA: 5.5 (ref 5.0–7.5)

## 2024-11-19 LAB — MICROSCOPIC EXAMINATION

## 2024-11-19 MED ORDER — NITROFURANTOIN MONOHYD MACRO 100 MG PO CAPS: 100.0000 mg | ORAL_CAPSULE | Freq: Two times a day (BID) | ORAL | 0 refills | Status: AC

## 2024-11-19 NOTE — Assessment & Plan Note (Signed)
 Chronic, receives injections from integrative medicine.  Continue this treatment and adjust as needed.  Labs: Vit D.

## 2024-11-19 NOTE — Assessment & Plan Note (Signed)
 Diagnosed by her integrative medicine provider, they order her NP Thyroid.  Labs today: TSH and Free T4. She would like PCP to takeover prescriptions next time they need refills, will alert PCP when due.

## 2024-11-19 NOTE — Assessment & Plan Note (Addendum)
 Noted on imaging 04/02/24. She prefers to focus on more natural treatments, continue Vitamin D  shots at home and repeat DEXA in 2 years, 2027. Check Vit D level.

## 2024-11-19 NOTE — Assessment & Plan Note (Signed)
 Acute, UA with multiple positive findings. Will send for culture. Start Macrobid  BID for 5 days, has multiple allergies to other abx. Will adjust regimen as needed based on culture results. Ensure good water intake at home and continue Azo as needed.

## 2024-11-19 NOTE — Progress Notes (Addendum)
 "  BP 122/82 (BP Location: Left Arm, Patient Position: Sitting, Cuff Size: Normal)   Pulse 68   Temp 97.6 F (36.4 C) (Oral)   Resp 16   Ht 5' 5.32 (1.659 m)   Wt 187 lb (84.8 kg)   SpO2 96%   BMI 30.82 kg/m    Subjective:    Patient ID: Lisa Hurley, female    DOB: 28-Oct-1956, 69 y.o.   MRN: [id number]  HPI: Lisa Hurley is a 69 y.o. female  Chief Complaint  Patient presents with   Follow-up   Urinary Tract Infection    Possible UTI   Since 2015 has had episodes of facial swelling, but they have never figured this out. Thinks it is related to certain herbs -- has noted with salsa. Took Allegra in past with no benefit. Does take Claritin  daily at present.  Has skin lesions on torso, was told seborrheic. Goes every 5 years to dermatology, [place].  URINARY SYMPTOMS Started on the 28th, had been walking a lot prior to. Took Azo which helped initially. Dysuria: a little  Urinary frequency: yes Urgency: yes Small volume voids: yes Symptom severity: yes Urinary incontinence: occasional Foul odor: no Hematuria: no Abdominal pain: no Back pain: no Suprapubic pain/pressure: yes Flank pain: no Fever:  no Vomiting: no Relief with pyridium: yes Status: stable Previous urinary tract infection: yes Recurrent urinary tract infection: no Sexual activity: No sexually active History of sexually transmitted disease: no Treatments attempted: pyridium and increasing fluids    HYPERTENSION without Chronic Kidney Disease Continues to take Amlodipine  and Omega 3.  Follows with functional provider, but not seeing as often. They have her on Naltrexone HCL for pain, 4.5 MG daily. Has chronic back pain, PT did not help her mid back. It helped her hips. Hypertension status: stable  Satisfied with current treatment? yes Duration of hypertension: chronic BP monitoring frequency:  not checking BP range:  BP medication side effects:  no Medication compliance: good compliance Aspirin:  no Recurrent headaches: no Visual changes: no Palpitations: no Dyspnea: no Chest pain: no Lower extremity edema: no Dizzy/lightheaded: no  The 10-year ASCVD risk score (Arnett DK, et al., 2019) is: 12.1%   Values used to calculate the score:     Age: 63 years     Clinically relevant sex: Female     Is Non-Hispanic African American: No     Diabetic: No     Tobacco smoker: No     Systolic Blood Pressure: 122 mmHg     Is BP treated: Yes     HDL Cholesterol: 56 mg/dL     Total Cholesterol: 241 mg/dL  HYPOTHYROIDISM Takes NP Thyroid 15 MG BID. History of elevation in A1c, would like checked today. Thyroid control status:stable Satisfied with current treatment? yes Medication side effects: no Medication compliance: good compliance Etiology of hypothyroidism: unknown Recent dose adjustment:yes functional medicine increased to 30 Fatigue: at times and no motivation/energy Cold intolerance: no Heat intolerance: no Weight gain: no Weight loss: no Constipation: yes Diarrhea/loose stools: yes Palpitations: no Lower extremity edema: no Anxiety/depressed mood: no   OSTEOPOROSIS Gets Vitamin D  injections from functional medicine provider. No recent falls or fractures. Prefers to focus more on supplements. T-score -2.6 on 04/02/24. Satisfied with current treatment?: yes Past osteoporosis medications/treatments: none Adequate calcium & vitamin D : yes Weight bearing exercises: yes   At baseline reports she has always been on the low end of mood. Currently is retired, but wants to find her new purpose. Her  goal this month is to check out [facility]. Has been feeling a bit touch deprived, but has never been a touchy feely person. Feels is not meant to be a complete hermit.    11/19/2024    9:23 AM 05/17/2024    9:21 AM 11/09/2023    9:44 AM 08/08/2023   10:34 AM 08/08/2023    9:49 AM  Depression screen PHQ 2/9  Decreased Interest 2 1 0 0 0  Down, Depressed, Hopeless 1 0 0 0 0   PHQ - 2 Score 3 1 0 0 0  Altered sleeping 2 0 1 0 2  Tired, decreased energy 3 3 2 3 3   Change in appetite 3 3 2 3 3   Feeling bad or failure about yourself  0 0 0 0 0  Trouble concentrating 3 1 2  0 2  Moving slowly or fidgety/restless 0 0 0 0 0  Suicidal thoughts 0 0 0 0 0  PHQ-9 Score 14 8  7  6  10    Difficult doing work/chores Not difficult at all Somewhat difficult Not difficult at all Not difficult at all Not difficult at all     Data saved with a previous flowsheet row definition       11/19/2024    9:23 AM 05/17/2024    9:21 AM 11/09/2023    9:46 AM 08/08/2023    9:49 AM  GAD 7 : Generalized Anxiety Score  Nervous, Anxious, on Edge 0 0 0 0  Control/stop worrying 0 1 0 0  Worry too much - different things 0 0 0 0  Trouble relaxing 0 0 0 0  Restless 0 0 0 0  Easily annoyed or irritable 0 0 1 0  Afraid - awful might happen 0 1 0 0  Total GAD 7 Score 0 2 1 0  Anxiety Difficulty Not difficult at all Not difficult at all Not difficult at all Not difficult at all   Relevant past medical, surgical, family and social history reviewed and updated as indicated. Interim medical history since our last visit reviewed. Allergies and medications reviewed and updated.  Review of Systems  Constitutional:  Negative for activity change, appetite change, diaphoresis, fatigue and fever.  Respiratory:  Negative for cough, chest tightness, shortness of breath and wheezing.   Cardiovascular:  Negative for chest pain, palpitations and leg swelling.  Gastrointestinal:  Positive for constipation and diarrhea.  Endocrine: Positive for heat intolerance. Negative for cold intolerance.  Genitourinary:  Positive for decreased urine volume, difficulty urinating, dysuria, frequency and urgency. Negative for flank pain and hematuria.  Neurological: Negative.   Psychiatric/Behavioral: Negative.     Per HPI unless specifically indicated above     Objective:    BP 122/82 (BP Location: Left Arm, Patient  Position: Sitting, Cuff Size: Normal)   Pulse 68   Temp 97.6 F (36.4 C) (Oral)   Resp 16   Ht 5' 5.32 (1.659 m)   Wt 187 lb (84.8 kg)   SpO2 96%   BMI 30.82 kg/m   Wt Readings from Last 3 Encounters:  11/19/24 187 lb (84.8 kg)  05/17/24 187 lb 3.2 oz (84.9 kg)  01/03/24 191 lb (86.6 kg)    Physical Exam Vitals and nursing note reviewed.  Constitutional:      General: She is awake. She is not in acute distress.    Appearance: She is well-developed and well-groomed. She is not ill-appearing or toxic-appearing.  HENT:     Head: Normocephalic.  Right Ear: Hearing and external ear normal.     Left Ear: Hearing and external ear normal.  Eyes:     General: Lids are normal.        Right eye: No discharge.        Left eye: No discharge.     Conjunctiva/sclera: Conjunctivae normal.     Pupils: Pupils are equal, round, and reactive to light.  Neck:     Thyroid: No thyromegaly.     Vascular: No carotid bruit.  Cardiovascular:     Rate and Rhythm: Normal rate and regular rhythm.     Heart sounds: Normal heart sounds. No murmur heard.    No gallop.  Pulmonary:     Effort: Pulmonary effort is normal. No accessory muscle usage or respiratory distress.     Breath sounds: Normal breath sounds. No decreased breath sounds, wheezing or rales.  Abdominal:     General: Bowel sounds are normal. There is no distension.     Palpations: Abdomen is soft.     Tenderness: There is no abdominal tenderness. There is no right CVA tenderness or left CVA tenderness.  Musculoskeletal:     Cervical back: Normal range of motion and neck supple.     Right lower leg: No edema.     Left lower leg: No edema.  Lymphadenopathy:     Cervical: No cervical adenopathy.  Skin:    General: Skin is warm and dry.  Neurological:     Mental Status: She is alert and oriented to person, place, and time.     Deep Tendon Reflexes: Reflexes are normal and symmetric.     Reflex Scores:      Brachioradialis reflexes  are 2+ on the right side and 2+ on the left side.      Patellar reflexes are 2+ on the right side and 2+ on the left side. Psychiatric:        Attention and Perception: Attention normal.        Mood and Affect: Mood normal.        Speech: Speech normal.        Behavior: Behavior normal. Behavior is cooperative.        Thought Content: Thought content normal.    Results for orders placed or performed in visit on 11/19/24  Microscopic Examination   Collection Time: 11/19/24 10:05 AM   Urine  Result Value Ref Range   WBC, UA 0-5 0 - 5 /hpf   RBC, Urine 3-10 (A) 0 - 2 /hpf   Epithelial Cells (non renal) 0-10 0 - 10 /hpf   Mucus, UA Present (A) Not Estab.   Bacteria, UA Few (A) None seen/Few  Urinalysis, Routine w reflex microscopic   Collection Time: 11/19/24 10:05 AM  Result Value Ref Range   Specific Gravity, UA 1.030 1.005 - 1.030   pH, UA 5.5 5.0 - 7.5   Color, UA Yellow Yellow   Appearance Ur Clear Clear   Leukocytes,UA 1+ (A) Negative   Protein,UA 3+ (A) Negative/Trace   Glucose, UA Negative Negative   Ketones, UA Trace (A) Negative   RBC, UA 3+ (A) Negative   Bilirubin, UA Comment (A) Negative   Urobilinogen, Ur 0.2 0.2 - 1.0 mg/dL   Nitrite, UA Negative Negative   Microscopic Examination See below:       Assessment & Plan:   Problem List Items Addressed This Visit       Cardiovascular and Mediastinum   Essential hypertension - Primary  Chronic, stable with BP ear goal for age today.  Continue current medication regimen and adjust as needed.  Recent labs with integrative health scanned into system.  Recommend she monitor BP at least a few mornings a week at home and document.  DASH diet at home.  Labs today: CBC, CMP, TSH.        Endocrine   Hypothyroidism   Diagnosed by her integrative medicine provider, they order her NP Thyroid.  Labs today: TSH and Free T4. She would like PCP to takeover prescriptions next time they need refills, will alert PCP when due.       Relevant Orders   T4, free   TSH   CBC with Differential/Platelet     Musculoskeletal and Integument   Osteoporosis   Noted on imaging 04/02/24. She prefers to focus on more natural treatments, continue Vitamin D  shots at home and repeat DEXA in 2 years, 2027. Check Vit D level.      Relevant Orders   VITAMIN D  25 Hydroxy (Vit-D Deficiency, Fractures)     Other   Prediabetes   Recheck A1c today and initiate medication as needed. For now focus on healthy diet and regular exercise.      Relevant Orders   HgB A1c   Comprehensive metabolic panel with GFR   Obesity   BMI 30.85.  Recommended eating smaller high protein, low fat meals more frequently and exercising 30 mins a day 5 times a week with a goal of 10-15lb weight loss in the next 3 months. Patient voiced their understanding and motivation to adhere to these recommendations.  Recommend getting a walking pad to use at home, which allows time to exercise and can help with arthritis pain.       Hyperlipidemia   Chronic.  No current medications.  Continue diet and exercise focus.  Labs today: Lipid panel and CMP.      Relevant Orders   Lipid Panel w/o Chol/HDL Ratio   Comprehensive metabolic panel with GFR   Frequent urination   Acute, UA with multiple positive findings. Will send for culture. Start Macrobid  BID for 5 days, has multiple allergies to other abx. Will adjust regimen as needed based on culture results. Ensure good water intake at home and continue Azo as needed.      Relevant Orders   Urine Culture   Urinalysis, Routine w reflex microscopic (Completed)   Microscopic Examination (Completed)   Facial swelling   She reports this as an ongoing and intermittent issues since 2015. Discussed that she would benefit from Epi Pen at home, she refuses to have this sent in due to cost and she reports she probably will not use it. Will place referral for allergist, would benefit more thorough allergy testing to determine  possible triggers. She is agreeable to this. Continue daily allergy medication.      Relevant Orders   Ambulatory referral to Allergy   Back pain   Chronic and ongoing issue for years.  Will continue Naltrexone and refill as needed + could adjust if worsening pain.  Continue with chiropractic care.  PT offered no benefit. Obtain imaging and determine next steps after results return. ?ortho vs neurosurgery.      Relevant Orders   DG Thoracic Spine W/Swimmers   DG Lumbar Spine Complete     Follow up plan: Return in about 6 months (around 05/19/2025) for HTN/HLD, Hypothyroid, Osteoporosis.      "

## 2024-11-19 NOTE — Assessment & Plan Note (Signed)
 Recheck A1c today and initiate medication as needed. For now focus on healthy diet and regular exercise.

## 2024-11-19 NOTE — Assessment & Plan Note (Signed)
 Chronic and ongoing issue for years.  Will continue Naltrexone and refill as needed + could adjust if worsening pain.  Continue with chiropractic care.  PT offered no benefit. Obtain imaging and determine next steps after results return. ?ortho vs neurosurgery.

## 2024-11-19 NOTE — Assessment & Plan Note (Signed)
 Chronic, stable with BP ear goal for age today.  Continue current medication regimen and adjust as needed.  Recent labs with integrative health scanned into system.  Recommend she monitor BP at least a few mornings a week at home and document.  DASH diet at home.  Labs today: CBC, CMP, TSH.

## 2024-11-19 NOTE — Assessment & Plan Note (Signed)
 Chronic.  No current medications.  Continue diet and exercise focus.  Labs today: Lipid panel and CMP.

## 2024-11-19 NOTE — Assessment & Plan Note (Signed)
 She reports this as an ongoing and intermittent issues since 2015. Discussed that she would benefit from Epi Pen at home, she refuses to have this sent in due to cost and she reports she probably will not use it. Will place referral for allergist, would benefit more thorough allergy testing to determine possible triggers. She is agreeable to this. Continue daily allergy medication.

## 2024-11-19 NOTE — Assessment & Plan Note (Signed)
 BMI 30.85.  Recommended eating smaller high protein, low fat meals more frequently and exercising 30 mins a day 5 times a week with a goal of 10-15lb weight loss in the next 3 months. Patient voiced their understanding and motivation to adhere to these recommendations.  Recommend getting a walking pad to use at home, which allows time to exercise and can help with arthritis pain.

## 2024-11-20 ENCOUNTER — Ambulatory Visit
Admission: RE | Admit: 2024-11-20 | Discharge: 2024-11-20 | Disposition: A | Discharge status: Home / Self Care | Source: Ambulatory Visit | Attending: Nurse Practitioner | Admitting: Nurse Practitioner

## 2024-11-20 ENCOUNTER — Other Ambulatory Visit: Payer: Self-pay | Admitting: Nurse Practitioner

## 2024-11-20 ENCOUNTER — Ambulatory Visit: Payer: Self-pay | Admitting: Nurse Practitioner

## 2024-11-20 DIAGNOSIS — E66811 Obesity, class 1: Secondary | ICD-10-CM

## 2024-11-20 DIAGNOSIS — M810 Age-related osteoporosis without current pathological fracture: Secondary | ICD-10-CM

## 2024-11-20 DIAGNOSIS — E039 Hypothyroidism, unspecified: Secondary | ICD-10-CM

## 2024-11-20 DIAGNOSIS — R220 Localized swelling, mass and lump, head: Secondary | ICD-10-CM

## 2024-11-20 DIAGNOSIS — G8929 Other chronic pain: Secondary | ICD-10-CM | POA: Insufficient documentation

## 2024-11-20 DIAGNOSIS — M546 Pain in thoracic spine: Secondary | ICD-10-CM | POA: Diagnosis present

## 2024-11-20 DIAGNOSIS — M545 Low back pain, unspecified: Secondary | ICD-10-CM

## 2024-11-20 DIAGNOSIS — E782 Mixed hyperlipidemia: Secondary | ICD-10-CM

## 2024-11-20 DIAGNOSIS — I10 Essential (primary) hypertension: Secondary | ICD-10-CM

## 2024-11-20 DIAGNOSIS — R350 Frequency of micturition: Secondary | ICD-10-CM

## 2024-11-20 DIAGNOSIS — R7303 Prediabetes: Secondary | ICD-10-CM

## 2024-11-20 LAB — COMPREHENSIVE METABOLIC PANEL WITH GFR
ALT: 17 IU/L (ref 0–32)
AST: 19 IU/L (ref 0–40)
Albumin: 3.9 g/dL (ref 3.9–4.9)
Alkaline Phosphatase: 99 IU/L (ref 49–135)
BUN/Creatinine Ratio: 11 — ABNORMAL LOW (ref 12–28)
BUN: 8 mg/dL (ref 8–27)
Bilirubin Total: 0.4 mg/dL (ref 0.0–1.2)
CO2: 20 mmol/L (ref 20–29)
Calcium: 9.1 mg/dL (ref 8.7–10.3)
Chloride: 103 mmol/L (ref 96–106)
Creatinine, Ser: 0.75 mg/dL (ref 0.57–1.00)
Globulin, Total: 2.5 g/dL (ref 1.5–4.5)
Glucose: 90 mg/dL (ref 70–99)
Potassium: 3.9 mmol/L (ref 3.5–5.2)
Sodium: 140 mmol/L (ref 134–144)
Total Protein: 6.4 g/dL (ref 6.0–8.5)
eGFR: 86 mL/min/1.73

## 2024-11-20 LAB — LIPID PANEL W/O CHOL/HDL RATIO
Cholesterol, Total: 194 mg/dL (ref 100–199)
HDL: 49 mg/dL
LDL Chol Calc (NIH): 117 mg/dL — ABNORMAL HIGH (ref 0–99)
Triglycerides: 156 mg/dL — ABNORMAL HIGH (ref 0–149)
VLDL Cholesterol Cal: 28 mg/dL (ref 5–40)

## 2024-11-20 LAB — CBC WITH DIFFERENTIAL/PLATELET
Basophils Absolute: 0 x10E3/uL (ref 0.0–0.2)
Basos: 1 %
EOS (ABSOLUTE): 0.1 x10E3/uL (ref 0.0–0.4)
Eos: 2 %
Hematocrit: 43 % (ref 34.0–46.6)
Hemoglobin: 14 g/dL (ref 11.1–15.9)
Immature Grans (Abs): 0 x10E3/uL (ref 0.0–0.1)
Immature Granulocytes: 0 %
Lymphocytes Absolute: 1.5 x10E3/uL (ref 0.7–3.1)
Lymphs: 26 %
MCH: 31 pg (ref 26.6–33.0)
MCHC: 32.6 g/dL (ref 31.5–35.7)
MCV: 95 fL (ref 79–97)
Monocytes Absolute: 0.4 x10E3/uL (ref 0.1–0.9)
Monocytes: 6 %
Neutrophils Absolute: 3.7 x10E3/uL (ref 1.4–7.0)
Neutrophils: 65 %
Platelets: 311 x10E3/uL (ref 150–450)
RBC: 4.51 x10E6/uL (ref 3.77–5.28)
RDW: 12.6 % (ref 11.7–15.4)
WBC: 5.8 x10E3/uL (ref 3.4–10.8)

## 2024-11-20 LAB — TSH: TSH: 0.653 u[IU]/mL (ref 0.450–4.500)

## 2024-11-20 LAB — VITAMIN D 25 HYDROXY (VIT D DEFICIENCY, FRACTURES): Vit D, 25-Hydroxy: 32.6 ng/mL (ref 30.0–100.0)

## 2024-11-20 LAB — HEMOGLOBIN A1C
Est. average glucose Bld gHb Est-mCnc: 111 mg/dL
Hgb A1c MFr Bld: 5.5 % (ref 4.8–5.6)

## 2024-11-20 LAB — T4, FREE: Free T4: 1.34 ng/dL (ref 0.82–1.77)

## 2024-11-20 NOTE — Progress Notes (Signed)
 Contacted via MyChart The 10-year ASCVD risk score (Arnett DK, et al., 2019) is: 11.7%   Values used to calculate the score:     Age: 69 years     Clinically relevant sex: Female     Is Non-Hispanic African American: No     Diabetic: No     Tobacco smoker: No     Systolic Blood Pressure: 122 mmHg     Is BP treated: Yes     HDL Cholesterol: 49 mg/dL     Total Cholesterol: 194 mg/dL  Good morning Lisa Hurley, your labs have returned and are overall stable with exception of LDL, bad cholesterol, however this has trended down from last check. Continue your Omega 3 daily at this time, based on numbers you would benefit statin therapy, but I know you prefer to keep prescription medications low. Any questions? Keep being stellar!!  Thank you for allowing me to participate in your care.  I appreciate you. Kindest regards, Evangelina Delancey

## 2024-11-21 LAB — URINE CULTURE

## 2024-11-21 NOTE — Progress Notes (Signed)
 Contacted via MyChart  Good afternoon Lisa Hurley, your urine culture returned and shows <100,000 growth. WE often do not treat unless >100,00, however I would complete your current course. It is intermediate so may work, the susceptible ones are majority of what you are allergic too and some can only be given IV. Any questions?

## 2024-11-26 ENCOUNTER — Ambulatory Visit: Payer: Self-pay | Admitting: Nurse Practitioner

## 2024-11-26 NOTE — Progress Notes (Signed)
 Contacted via MyChart  Good morning Lisa Hurley, your imaging has returned: - Lower back is showing increase in disc space loss, cushion loss with a new area where the disc is slipping back on the other disc at L2 and L3. - There is osteopenia, thinning of bones, noted on lumbar spine. - Some mild scoliosis at mid back. You may benefit a visit with either neurosurgery or even physiatry, they do steroid injections in the back, if pain continues. Let me know if you wish to pursue this. Any questions? Keep being amazing!!  Thank you for allowing me to participate in your care.  I appreciate you. Kindest regards, Dannell Gortney

## 2024-11-26 NOTE — Progress Notes (Signed)
 Sent message via lower back imaging

## 2024-12-06 ENCOUNTER — Ambulatory Visit (INDEPENDENT_AMBULATORY_CARE_PROVIDER_SITE_OTHER): Admitting: Nurse Practitioner

## 2024-12-06 ENCOUNTER — Encounter: Payer: Self-pay | Admitting: Nurse Practitioner

## 2024-12-06 VITALS — BP 121/73 | HR 89 | Temp 98.4°F | Ht 65.5 in | Wt 188.0 lb

## 2024-12-06 DIAGNOSIS — R350 Frequency of micturition: Secondary | ICD-10-CM

## 2024-12-06 LAB — MICROSCOPIC EXAMINATION
Bacteria, UA: NONE SEEN
Epithelial Cells (non renal): NONE SEEN /HPF (ref 0–10)
WBC, UA: NONE SEEN /HPF (ref 0–5)

## 2024-12-06 LAB — URINALYSIS, ROUTINE W REFLEX MICROSCOPIC
Bilirubin, UA: NEGATIVE
Glucose, UA: NEGATIVE
Ketones, UA: NEGATIVE
Leukocytes,UA: NEGATIVE
Nitrite, UA: NEGATIVE
Protein,UA: NEGATIVE
Specific Gravity, UA: 1.025 (ref 1.005–1.030)
Urobilinogen, Ur: 0.2 mg/dL (ref 0.2–1.0)
pH, UA: 5.5 (ref 5.0–7.5)

## 2024-12-06 LAB — WET PREP FOR TRICH, YEAST, CLUE
Clue Cell Exam: NEGATIVE
Trichomonas Exam: NEGATIVE
Yeast Exam: NEGATIVE

## 2024-12-06 NOTE — Assessment & Plan Note (Signed)
 Ongoing issue. UA overall reassuring today with only trace blood. Wet prep negative. She is unable to use vaginal estrace, but suspect she would benefit from vaginal treatment, even moisturizer, to help with urinary symptoms. Discussed with her today. If ongoing issues could consider a referral to urology. Ensure good fluid intake at home and work on pelvic floor therapy.

## 2024-12-06 NOTE — Progress Notes (Signed)
 "  BP 121/73   Pulse 89   Temp 98.4 F (36.9 C) (Oral)   Ht 5' 5.5 (1.664 m)   Wt 188 lb (85.3 kg)   SpO2 98%   BMI 30.81 kg/m    Subjective:    Patient ID: Lisa Hurley, female    DOB: 10-18-1956, 69 y.o.   MRN: [id number]  HPI: Lisa Hurley is a 69 y.o. female  Chief Complaint  Patient presents with   Urinary Tract Infection    Patient states she has been noticing pressure in her pelvic area and urinary frequency. States these symptoms have gotten better but still wanted to be checked for another UTI.    URINARY SYMPTOMS Treated with Macrobid  on 11/19/24. Having pressure and urgency at present. Cramping has eased up. Dysuria: no Urinary frequency: yes Urgency: yes Small volume voids: depends on the day Symptom severity: yes Urinary incontinence: yes Foul odor: no Hematuria: no Abdominal pain: no Back pain: no Suprapubic pain/pressure: yes Flank pain: no Fever:  no Vomiting: no Status: stable Previous urinary tract infection: yes Recurrent urinary tract infection: no Sexual activity: No sexually active History of sexually transmitted disease: no Treatments attempted: antibiotics and increasing fluids, Trace Minerals    Relevant past medical, surgical, family and social history reviewed and updated as indicated. Interim medical history since our last visit reviewed. Allergies and medications reviewed and updated.  Review of Systems  Constitutional:  Negative for activity change, appetite change, diaphoresis, fatigue and fever.  Respiratory:  Negative for cough, chest tightness, shortness of breath and wheezing.   Cardiovascular:  Negative for chest pain, palpitations and leg swelling.  Gastrointestinal: Negative.   Genitourinary:  Positive for frequency and urgency. Negative for decreased urine volume, difficulty urinating, dysuria, flank pain and hematuria.  Neurological: Negative.   Psychiatric/Behavioral: Negative.      Per HPI unless specifically  indicated above     Objective:    BP 121/73   Pulse 89   Temp 98.4 F (36.9 C) (Oral)   Ht 5' 5.5 (1.664 m)   Wt 188 lb (85.3 kg)   SpO2 98%   BMI 30.81 kg/m   Wt Readings from Last 3 Encounters:  12/06/24 188 lb (85.3 kg)  11/19/24 187 lb (84.8 kg)  05/17/24 187 lb 3.2 oz (84.9 kg)    Physical Exam Vitals and nursing note reviewed.  Constitutional:      General: She is awake. She is not in acute distress.    Appearance: She is well-developed and well-groomed. She is not ill-appearing or toxic-appearing.  HENT:     Head: Normocephalic.     Right Ear: Hearing and external ear normal.     Left Ear: Hearing and external ear normal.  Eyes:     General: Lids are normal.        Right eye: No discharge.        Left eye: No discharge.     Conjunctiva/sclera: Conjunctivae normal.     Pupils: Pupils are equal, round, and reactive to light.  Neck:     Thyroid: No thyromegaly.     Vascular: No carotid bruit.  Cardiovascular:     Rate and Rhythm: Normal rate and regular rhythm.     Heart sounds: Normal heart sounds. No murmur heard.    No gallop.  Pulmonary:     Effort: Pulmonary effort is normal. No accessory muscle usage or respiratory distress.     Breath sounds: Normal breath sounds. No decreased breath sounds, wheezing  or rales.  Abdominal:     General: Bowel sounds are normal. There is no distension.     Palpations: Abdomen is soft.     Tenderness: There is no abdominal tenderness. There is no right CVA tenderness or left CVA tenderness.  Musculoskeletal:     Cervical back: Normal range of motion and neck supple.     Right lower leg: No edema.     Left lower leg: No edema.  Lymphadenopathy:     Cervical: No cervical adenopathy.  Skin:    General: Skin is warm and dry.  Neurological:     Mental Status: She is alert and oriented to person, place, and time.     Deep Tendon Reflexes: Reflexes are normal and symmetric.     Reflex Scores:      Brachioradialis reflexes  are 2+ on the right side and 2+ on the left side.      Patellar reflexes are 2+ on the right side and 2+ on the left side. Psychiatric:        Attention and Perception: Attention normal.        Mood and Affect: Mood normal.        Speech: Speech normal.        Behavior: Behavior normal. Behavior is cooperative.        Thought Content: Thought content normal.     Results for orders placed or performed in visit on 11/19/24  Urine Culture   Collection Time: 11/19/24 10:05 AM   Specimen: Urine   UR  Result Value Ref Range   Urine Culture, Routine Final report (A)    Organism ID, Bacteria Klebsiella pneumoniae (A)    Antimicrobial Susceptibility Comment   Microscopic Examination   Collection Time: 11/19/24 10:05 AM   Urine  Result Value Ref Range   WBC, UA 0-5 0 - 5 /hpf   RBC, Urine 3-10 (A) 0 - 2 /hpf   Epithelial Cells (non renal) 0-10 0 - 10 /hpf   Mucus, UA Present (A) Not Estab.   Bacteria, UA Few (A) None seen/Few  Urinalysis, Routine w reflex microscopic   Collection Time: 11/19/24 10:05 AM  Result Value Ref Range   Specific Gravity, UA 1.030 1.005 - 1.030   pH, UA 5.5 5.0 - 7.5   Color, UA Yellow Yellow   Appearance Ur Clear Clear   Leukocytes,UA 1+ (A) Negative   Protein,UA 3+ (A) Negative/Trace   Glucose, UA Negative Negative   Ketones, UA Trace (A) Negative   RBC, UA 3+ (A) Negative   Bilirubin, UA Comment (A) Negative   Urobilinogen, Ur 0.2 0.2 - 1.0 mg/dL   Nitrite, UA Negative Negative   Microscopic Examination See below:   T4, free   Collection Time: 11/19/24 10:06 AM  Result Value Ref Range   Free T4 1.34 0.82 - 1.77 ng/dL  TSH   Collection Time: 11/19/24 10:06 AM  Result Value Ref Range   TSH 0.653 0.450 - 4.500 uIU/mL  HgB A1c   Collection Time: 11/19/24 10:06 AM  Result Value Ref Range   Hgb A1c MFr Bld 5.5 4.8 - 5.6 %   Est. average glucose Bld gHb Est-mCnc 111 mg/dL  VITAMIN D  25 Hydroxy (Vit-D Deficiency, Fractures)   Collection Time:  11/19/24 10:06 AM  Result Value Ref Range   Vit D, 25-Hydroxy 32.6 30.0 - 100.0 ng/mL  Lipid Panel w/o Chol/HDL Ratio   Collection Time: 11/19/24 10:06 AM  Result Value Ref Range   Cholesterol,  Total 194 100 - 199 mg/dL   Triglycerides 843 (H) 0 - 149 mg/dL   HDL 49 >60 mg/dL   VLDL Cholesterol Cal 28 5 - 40 mg/dL   LDL Chol Calc (NIH) 882 (H) 0 - 99 mg/dL  Comprehensive metabolic panel with GFR   Collection Time: 11/19/24 10:06 AM  Result Value Ref Range   Glucose 90 70 - 99 mg/dL   BUN 8 8 - 27 mg/dL   Creatinine, Ser 9.24 0.57 - 1.00 mg/dL   eGFR 86 >40 fO/fpw/8.26   BUN/Creatinine Ratio 11 (L) 12 - 28   Sodium 140 134 - 144 mmol/L   Potassium 3.9 3.5 - 5.2 mmol/L   Chloride 103 96 - 106 mmol/L   CO2 20 20 - 29 mmol/L   Calcium 9.1 8.7 - 10.3 mg/dL   Total Protein 6.4 6.0 - 8.5 g/dL   Albumin 3.9 3.9 - 4.9 g/dL   Globulin, Total 2.5 1.5 - 4.5 g/dL   Bilirubin Total 0.4 0.0 - 1.2 mg/dL   Alkaline Phosphatase 99 49 - 135 IU/L   AST 19 0 - 40 IU/L   ALT 17 0 - 32 IU/L  CBC with Differential/Platelet   Collection Time: 11/19/24 10:06 AM  Result Value Ref Range   WBC 5.8 3.4 - 10.8 x10E3/uL   RBC 4.51 3.77 - 5.28 x10E6/uL   Hemoglobin 14.0 11.1 - 15.9 g/dL   Hematocrit 56.9 65.9 - 46.6 %   MCV 95 79 - 97 fL   MCH 31.0 26.6 - 33.0 pg   MCHC 32.6 31.5 - 35.7 g/dL   RDW 87.3 88.2 - 84.5 %   Platelets 311 150 - 450 x10E3/uL   Neutrophils 65 Not Estab. %   Lymphs 26 Not Estab. %   Monocytes 6 Not Estab. %   Eos 2 Not Estab. %   Basos 1 Not Estab. %   Neutrophils Absolute 3.7 1.4 - 7.0 x10E3/uL   Lymphocytes Absolute 1.5 0.7 - 3.1 x10E3/uL   Monocytes Absolute 0.4 0.1 - 0.9 x10E3/uL   EOS (ABSOLUTE) 0.1 0.0 - 0.4 x10E3/uL   Basophils Absolute 0.0 0.0 - 0.2 x10E3/uL   Immature Granulocytes 0 Not Estab. %   Immature Grans (Abs) 0.0 0.0 - 0.1 x10E3/uL      Assessment & Plan:   Problem List Items Addressed This Visit       Other   Urinary frequency - Primary    Ongoing issue. UA overall reassuring today with only trace blood. Wet prep negative. She is unable to use vaginal estrace, but suspect she would benefit from vaginal treatment, even moisturizer, to help with urinary symptoms. Discussed with her today. If ongoing issues could consider a referral to urology. Ensure good fluid intake at home and work on pelvic floor therapy.       Relevant Orders   Urinalysis, Routine w reflex microscopic   WET PREP FOR TRICH, YEAST, CLUE     Follow up plan: Return if symptoms worsen or fail to improve.      "

## 2024-12-06 NOTE — Patient Instructions (Signed)
 UTI (Urinary Tract Infection) in Females: What to Know A urinary tract infection (UTI) is an infection in your urinary tract. The urinary tract is made up of organs that make, store, and get rid of pee (urine) in your body. These organs include: The kidneys. The ureters. The bladder. The urethra. What are the causes? Most UTIs are caused by germs called bacteria. They may be in or near your genitals. These germs grow and cause swelling in your urinary tract. What increases the risk? You're more likely to get a UTI if: You're a female. The urethra is shorter in females than in males. You have a soft tube called a catheter that drains your pee. You can't control when you pee or poop. You have trouble peeing because of: A kidney stone. A urinary blockage. A nerve condition that affects your bladder. Not getting enough to drink. You're sexually active. You use a birth control inside your vagina, like spermicide. You're pregnant. You have low levels of the hormone estrogen in your body. You're an older adult. You're also more likely to get a UTI if you have other health problems. These may include: Diabetes. A weak immune system. Your immune system is your body's defense system. Sickle cell disease. Injury of the spine. What are the signs or symptoms? Symptoms may include: Needing to pee right away. Peeing small amounts often. Pain or burning when you pee. Blood in your pee. Pee that smells bad or odd. Pain in your belly or lower back. You may also: Feel confused. This may be the first symptom in older adults. Vomit. Not feel hungry. Feel tired or easily annoyed. Have a fever or chills. How is this diagnosed? A UTI is diagnosed based on your medical history and an exam. You may also have other tests. These may include: Pee tests. Blood tests. Tests for sexually transmitted infections (STIs). If you've had more than one UTI, you may need to have imaging studies done to find  out why you keep getting them. How is this treated? A UTI can be treated by: Taking antibiotics or other medicines. Drinking enough fluid to keep your pee pale yellow. In rare cases, a UTI can cause a very bad condition called sepsis. Sepsis may be treated in the hospital. Follow these instructions at home: Medicines Take your medicines only as told by your health care provider. If you were given antibiotics, take them as told by your provider. Do not stop taking them even if you start to feel better. General instructions Make sure you: Pee often and fully. Do not hold your pee for a long time. Wipe from front to back after you pee or poop. Use each tissue only once when you wipe. Pee after you have sex. Do not douche or use sprays or powders in your genital area. Contact a health care provider if: Your symptoms don't get better after 1-2 days of taking antibiotics. Your symptoms go away and then come back. You have a fever or chills. You vomit or feel like you may vomit. Get help right away if: You have very bad pain in your back or lower belly. You faint. This information is not intended to replace advice given to you by your health care provider. Make sure you discuss any questions you have with your health care provider. Document Revised: 09/05/2024 Document Reviewed: 01/27/2023 Elsevier Patient Education  2025 [employer].

## 2024-12-26 ENCOUNTER — Ambulatory Visit: Payer: Self-pay | Admitting: Allergy & Immunology

## 2025-05-20 ENCOUNTER — Ambulatory Visit: Admitting: Nurse Practitioner
